# Patient Record
Sex: Female | Born: 1985 | Race: White | Hispanic: No | Marital: Married | Smoking: Former smoker
Health system: Southern US, Community
[De-identification: ages and names within clinical notes are randomized; demographics above are authoritative.]

## PROBLEM LIST (undated history)

## (undated) DIAGNOSIS — O24419 Gestational diabetes mellitus in pregnancy, unspecified control: Secondary | ICD-10-CM

## (undated) DIAGNOSIS — Z9889 Other specified postprocedural states: Secondary | ICD-10-CM

## (undated) DIAGNOSIS — F419 Anxiety disorder, unspecified: Secondary | ICD-10-CM

## (undated) DIAGNOSIS — Z9289 Personal history of other medical treatment: Secondary | ICD-10-CM

## (undated) DIAGNOSIS — R112 Nausea with vomiting, unspecified: Secondary | ICD-10-CM

## (undated) DIAGNOSIS — Z862 Personal history of diseases of the blood and blood-forming organs and certain disorders involving the immune mechanism: Secondary | ICD-10-CM

## (undated) HISTORY — PX: OTHER SURGICAL HISTORY: SHX169

## (undated) HISTORY — DX: Anxiety disorder, unspecified: F41.9

## (undated) HISTORY — DX: Gestational diabetes mellitus in pregnancy, unspecified control: O24.419

## (undated) HISTORY — DX: Personal history of diseases of the blood and blood-forming organs and certain disorders involving the immune mechanism: Z86.2

---
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
## 2008-01-06 IMAGING — CT CT PARANASAL SINUSES W/O CM
1 series · 16 of 24 positions shown, 20 images · non-contrast
Comparison: none

REASON FOR EXAM: Chronic right sided headache, clinical sinusitis
COMMENTS:

[Series 2: sinus · axial · 0.26mm/px · z∈[+763,+863]mm · 16 of 24 slices shown, 20 images]
[im 2/24  brain]
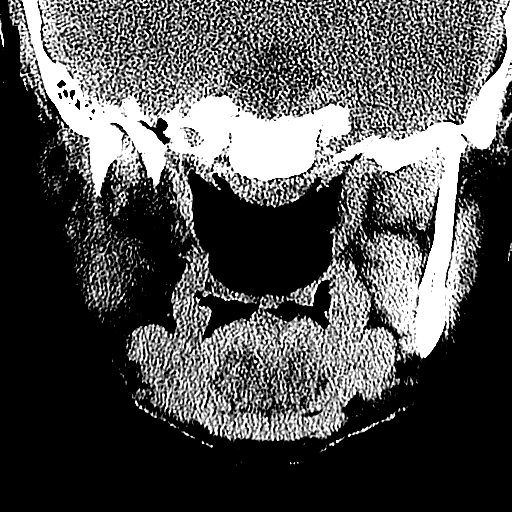
[im 2/24  bone]
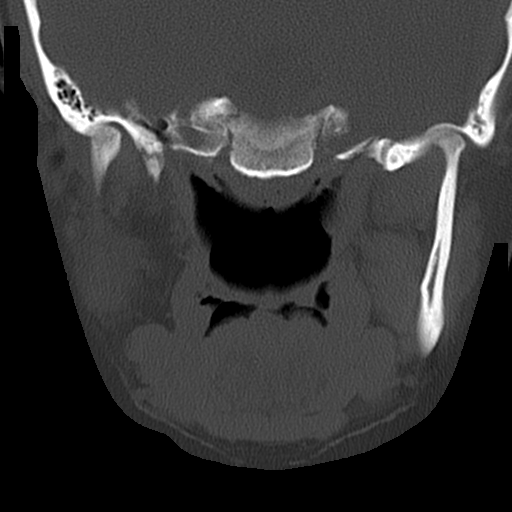
[im 4/24  bone]
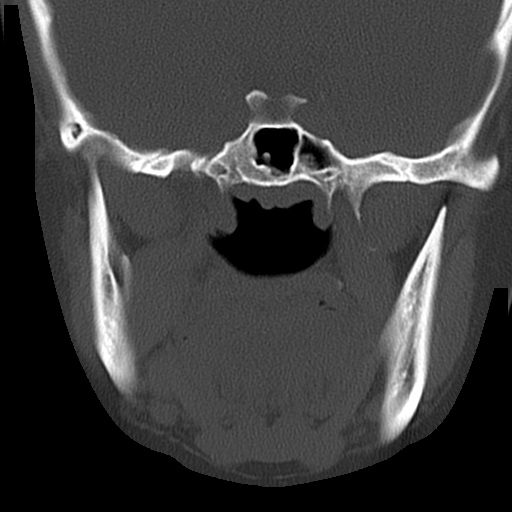
[im 5/24  bone]
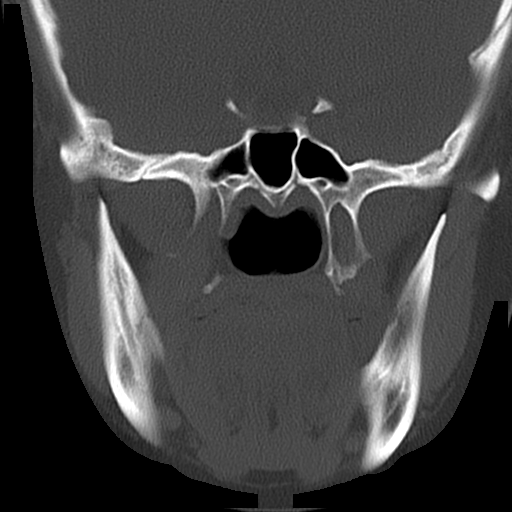
[im 6/24  bone]
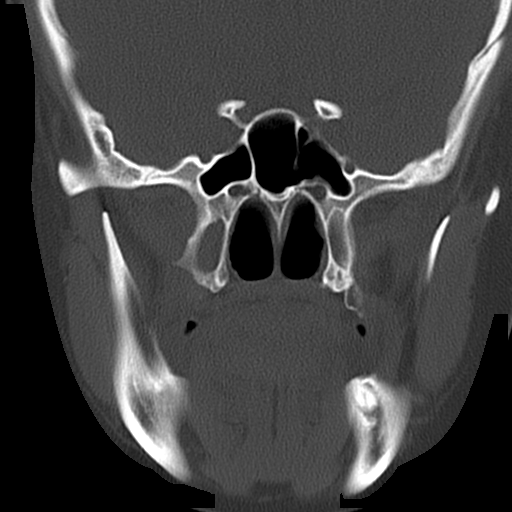
[im 8/24  brain]
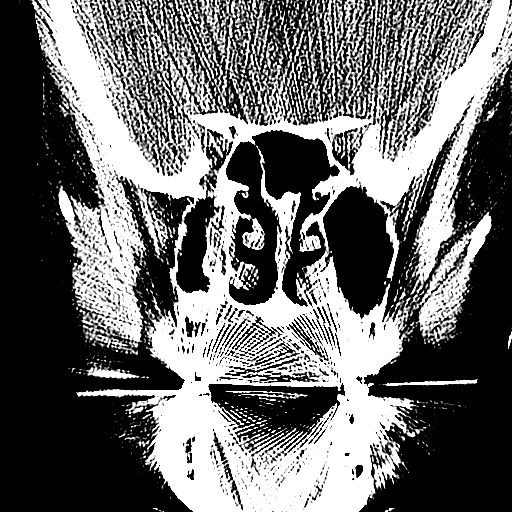
[im 8/24  bone]
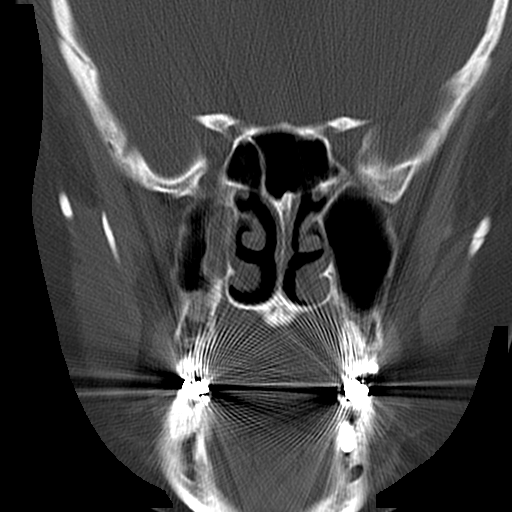
[im 9/24  bone]
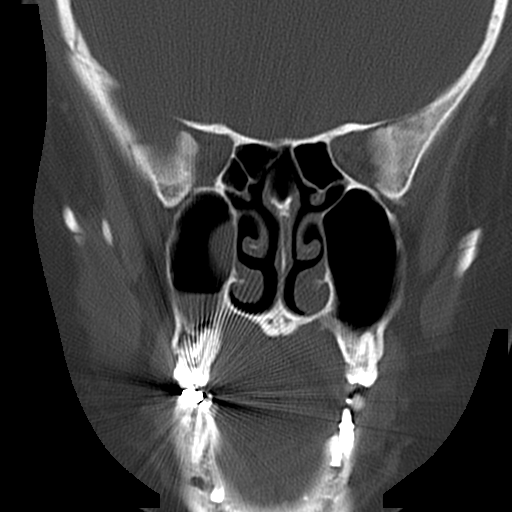
[im 10/24  bone]
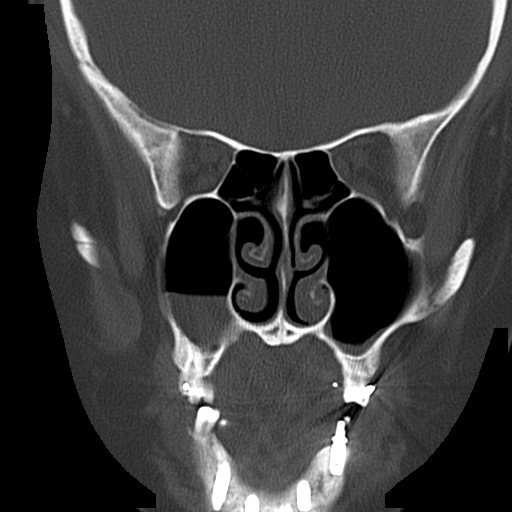
[im 12/24  bone]
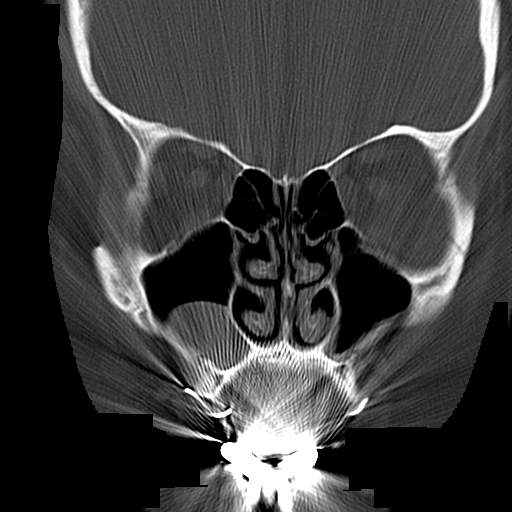
[im 13/24  brain]
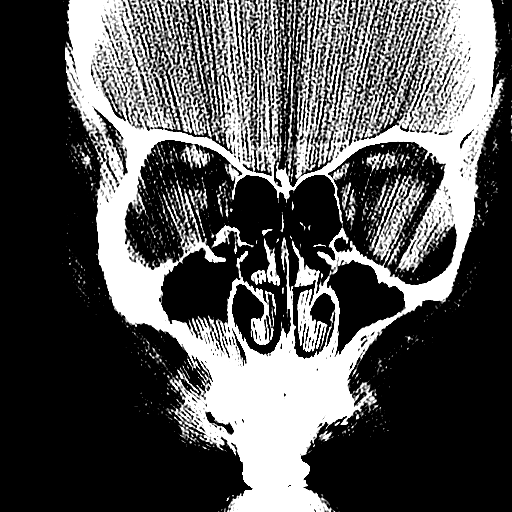
[im 13/24  bone]
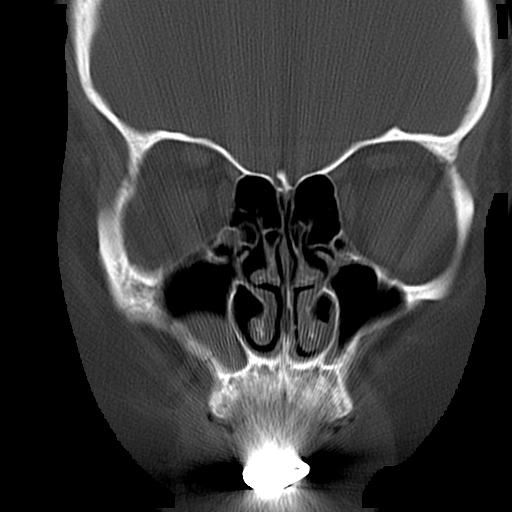
[im 15/24  bone]
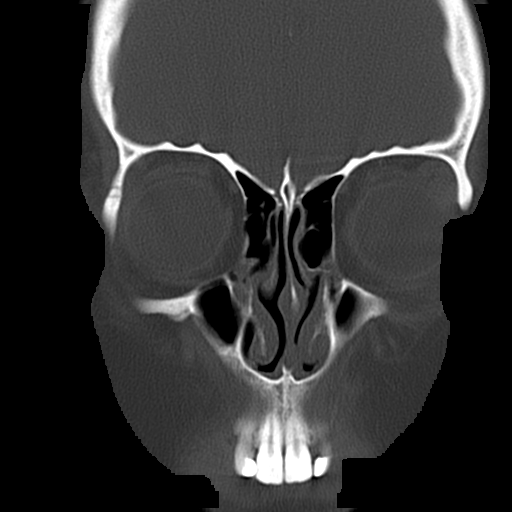
[im 16/24  bone]
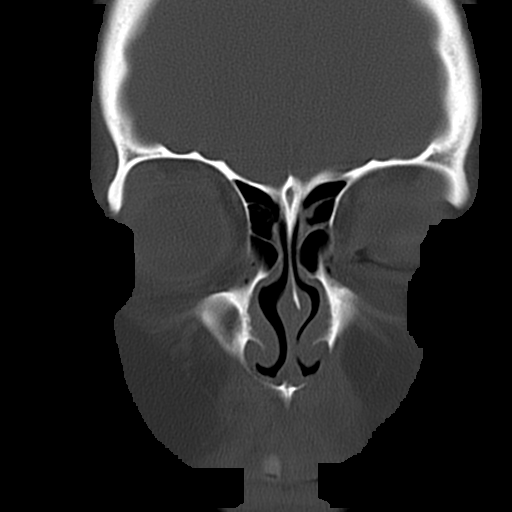
[im 17/24  bone]
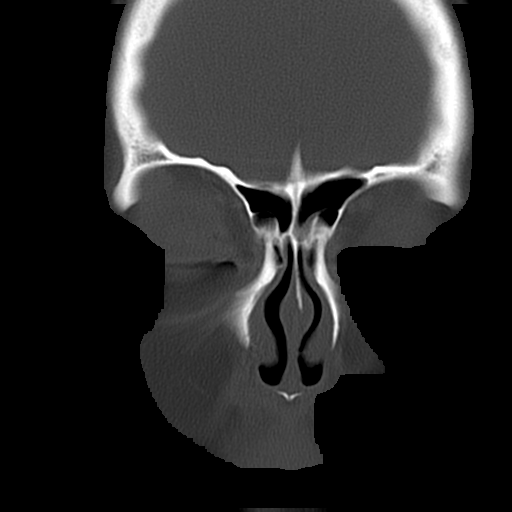
[im 19/24  brain]
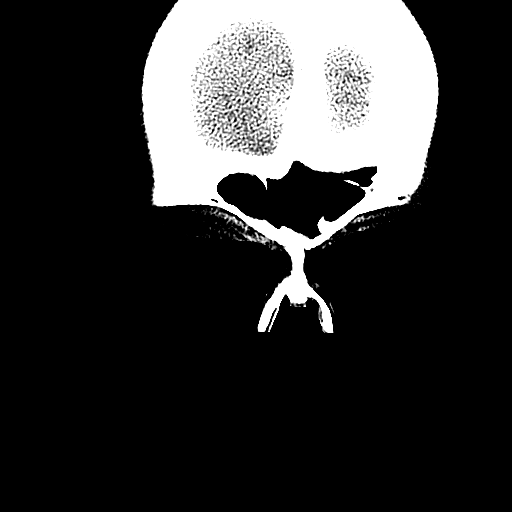
[im 19/24  bone]
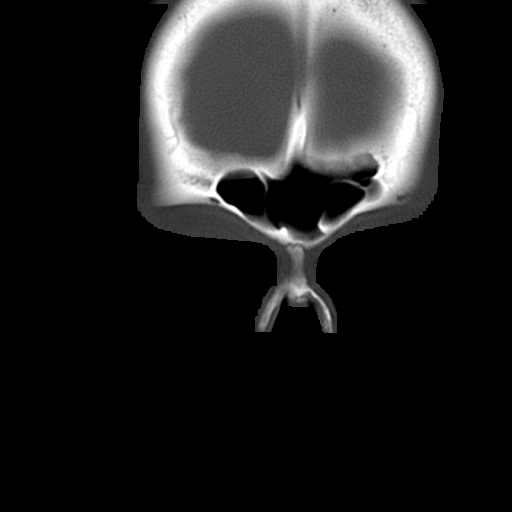
[im 20/24  bone]
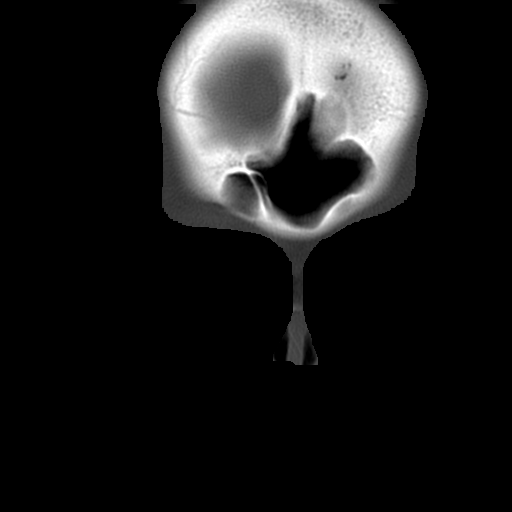
[im 21/24  bone]
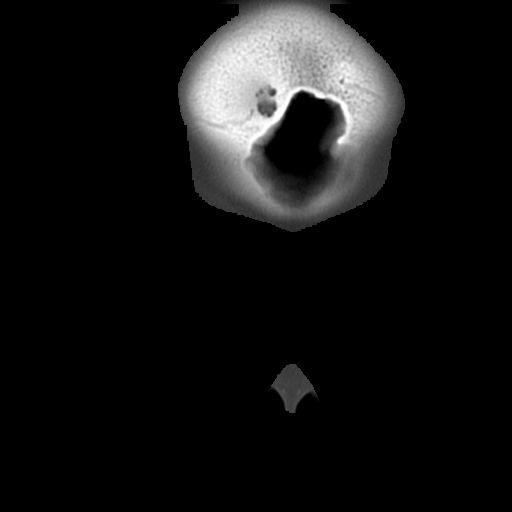
[im 23/24  bone]
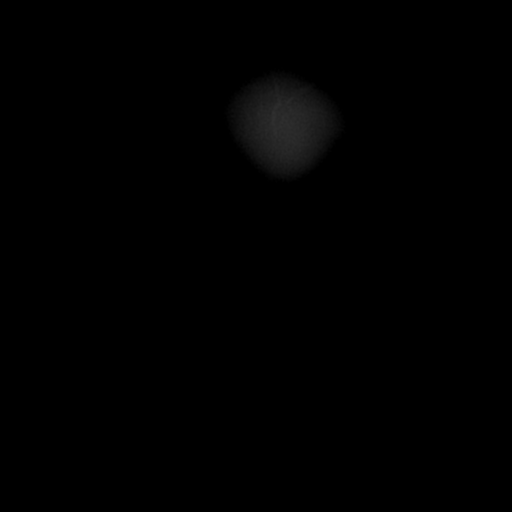

[16 of 24 positions shown; findings below may reference images not displayed]

PROCEDURE:     CT  - CT SINUSES WITHOUT CONTRAST  - January 31, 2006  [DATE]

RESULT:     The patient is being evaluated for chronic right sided headache
as well as sinusitis.

The frontal sinuses are well pneumatized. The ethmoid sinuses demonstrate a
small amount of mucoperiosteal thickening in their mid portion anteriorly.
This does impact the ostiomeatal unit. The right maxillary sinus exhibits a
retention cyst versus polyp in the inferomedial aspect. The left maxillary
sinus is clear. There may be a small amount of fluid in the right maxillary
sinus. The right ethmoid as well as both sphenoid sinuses are clear. No bony
erosive change is seen.
IMPRESSION: There are findings consistent with inflammatory changes
involving the right ethmoid and right maxillary sinuses. I cannot exclude a
small amount of fluid in the inferior aspect of the right maxillary sinus.

## 2008-04-23 DIAGNOSIS — O24419 Gestational diabetes mellitus in pregnancy, unspecified control: Secondary | ICD-10-CM

## 2008-04-23 HISTORY — DX: Gestational diabetes mellitus in pregnancy, unspecified control: O24.419

## 2008-05-03 ENCOUNTER — Encounter: Payer: Self-pay | Admitting: Family Medicine

## 2008-05-03 ENCOUNTER — Ambulatory Visit: Payer: Self-pay | Admitting: Obstetrics & Gynecology

## 2008-05-03 LAB — CONVERTED CEMR LAB
Basophils Absolute: 0 10*3/uL (ref 0.0–0.1)
Basophils Relative: 0 % (ref 0–1)
Hepatitis B Surface Ag: NEGATIVE
MCHC: 34.7 g/dL (ref 30.0–36.0)
Neutro Abs: 4.5 10*3/uL (ref 1.7–7.7)
Neutrophils Relative %: 65 % (ref 43–77)
RBC: 4.26 M/uL (ref 3.87–5.11)
RDW: 13.2 % (ref 11.5–15.5)
Rubella: 32 intl units/mL — ABNORMAL HIGH

## 2008-05-10 ENCOUNTER — Ambulatory Visit (HOSPITAL_COMMUNITY): Admission: RE | Admit: 2008-05-10 | Discharge: 2008-05-10 | Payer: Self-pay | Admitting: Family Medicine

## 2008-05-18 ENCOUNTER — Ambulatory Visit: Payer: Self-pay | Admitting: Obstetrics & Gynecology

## 2008-05-18 ENCOUNTER — Encounter: Payer: Self-pay | Admitting: Obstetrics & Gynecology

## 2008-06-14 ENCOUNTER — Ambulatory Visit: Payer: Self-pay | Admitting: Obstetrics and Gynecology

## 2008-06-15 ENCOUNTER — Encounter: Payer: Self-pay | Admitting: Family Medicine

## 2008-06-25 ENCOUNTER — Ambulatory Visit (HOSPITAL_COMMUNITY): Admission: RE | Admit: 2008-06-25 | Discharge: 2008-06-25 | Payer: Self-pay | Admitting: Obstetrics & Gynecology

## 2008-07-12 ENCOUNTER — Ambulatory Visit: Payer: Self-pay | Admitting: Obstetrics and Gynecology

## 2008-07-13 ENCOUNTER — Encounter: Payer: Self-pay | Admitting: Family Medicine

## 2008-07-15 ENCOUNTER — Ambulatory Visit: Payer: Self-pay | Admitting: Family Medicine

## 2008-07-15 LAB — CONVERTED CEMR LAB

## 2008-07-17 ENCOUNTER — Observation Stay (HOSPITAL_COMMUNITY): Admission: AD | Admit: 2008-07-17 | Discharge: 2008-07-18 | Payer: Self-pay | Admitting: Family Medicine

## 2008-07-17 ENCOUNTER — Encounter: Payer: Self-pay | Admitting: Family Medicine

## 2008-08-16 ENCOUNTER — Ambulatory Visit: Payer: Self-pay | Admitting: Obstetrics & Gynecology

## 2008-08-23 ENCOUNTER — Ambulatory Visit: Payer: Self-pay | Admitting: Family Medicine

## 2008-08-30 ENCOUNTER — Ambulatory Visit: Payer: Self-pay | Admitting: Obstetrics & Gynecology

## 2008-08-30 ENCOUNTER — Encounter: Payer: Self-pay | Admitting: Family Medicine

## 2008-08-30 LAB — CONVERTED CEMR LAB
Hemoglobin: 9.8 g/dL — ABNORMAL LOW (ref 12.0–15.0)
RBC: 3.13 M/uL — ABNORMAL LOW (ref 3.87–5.11)
RDW: 14 % (ref 11.5–15.5)

## 2008-09-06 ENCOUNTER — Ambulatory Visit: Payer: Self-pay | Admitting: Family Medicine

## 2008-09-13 ENCOUNTER — Encounter: Admission: RE | Admit: 2008-09-13 | Discharge: 2008-09-13 | Payer: Self-pay | Admitting: Obstetrics & Gynecology

## 2008-09-13 ENCOUNTER — Ambulatory Visit: Payer: Self-pay | Admitting: Obstetrics & Gynecology

## 2008-09-17 ENCOUNTER — Ambulatory Visit (HOSPITAL_COMMUNITY): Admission: RE | Admit: 2008-09-17 | Discharge: 2008-09-17 | Payer: Self-pay | Admitting: Obstetrics & Gynecology

## 2008-09-21 ENCOUNTER — Ambulatory Visit (HOSPITAL_COMMUNITY): Admission: RE | Admit: 2008-09-21 | Discharge: 2008-09-21 | Payer: Self-pay | Admitting: Obstetrics & Gynecology

## 2008-09-25 ENCOUNTER — Observation Stay (HOSPITAL_COMMUNITY): Admission: AD | Admit: 2008-09-25 | Discharge: 2008-09-26 | Payer: Self-pay | Admitting: Obstetrics & Gynecology

## 2008-09-27 ENCOUNTER — Inpatient Hospital Stay (HOSPITAL_COMMUNITY): Admission: AD | Admit: 2008-09-27 | Discharge: 2008-09-29 | Payer: Self-pay | Admitting: Obstetrics & Gynecology

## 2008-09-27 ENCOUNTER — Ambulatory Visit: Payer: Self-pay | Admitting: Obstetrics & Gynecology

## 2008-09-28 ENCOUNTER — Encounter: Payer: Self-pay | Admitting: Obstetrics & Gynecology

## 2008-09-29 ENCOUNTER — Encounter: Payer: Self-pay | Admitting: Obstetrics & Gynecology

## 2008-10-28 ENCOUNTER — Ambulatory Visit: Payer: Self-pay | Admitting: Obstetrics & Gynecology

## 2008-11-03 ENCOUNTER — Ambulatory Visit: Payer: Self-pay | Admitting: Obstetrics & Gynecology

## 2008-12-06 ENCOUNTER — Encounter: Payer: Self-pay | Admitting: Obstetrics and Gynecology

## 2008-12-06 ENCOUNTER — Ambulatory Visit: Payer: Self-pay | Admitting: Obstetrics and Gynecology

## 2008-12-15 ENCOUNTER — Ambulatory Visit: Payer: Self-pay | Admitting: Family Medicine

## 2008-12-15 LAB — CONVERTED CEMR LAB: Glucose, Fasting: 74 mg/dL (ref 70–99)

## 2010-02-27 ENCOUNTER — Ambulatory Visit: Payer: Self-pay | Admitting: Internal Medicine

## 2010-05-15 ENCOUNTER — Encounter: Payer: Self-pay | Admitting: Obstetrics & Gynecology

## 2010-05-15 ENCOUNTER — Encounter: Payer: Self-pay | Admitting: Obstetrics

## 2010-07-31 LAB — COMPREHENSIVE METABOLIC PANEL
AST: 19 U/L (ref 0–37)
BUN: 4 mg/dL — ABNORMAL LOW (ref 6–23)
BUN: 5 mg/dL — ABNORMAL LOW (ref 6–23)
CO2: 23 mEq/L (ref 19–32)
CO2: 23 mEq/L (ref 19–32)
Calcium: 8.8 mg/dL (ref 8.4–10.5)
Calcium: 9 mg/dL (ref 8.4–10.5)
Chloride: 105 mEq/L (ref 96–112)
Creatinine, Ser: 0.48 mg/dL (ref 0.4–1.2)
Creatinine, Ser: 0.52 mg/dL (ref 0.4–1.2)
GFR calc Af Amer: >60 mL/min (ref >60)
GFR calc non Af Amer: >60 mL/min (ref >60)
GFR calc non Af Amer: >60 mL/min (ref >60)
Glucose, Bld: 88 mg/dL (ref 70–99)
Total Bilirubin: 0.7 mg/dL (ref 0.3–1.2)

## 2010-07-31 LAB — GLUCOSE, CAPILLARY
Glucose-Capillary: 114 mg/dL — ABNORMAL HIGH (ref 70–99)
Glucose-Capillary: 92 mg/dL (ref 70–99)
Glucose-Capillary: 92 mg/dL (ref 70–99)
Glucose-Capillary: 96 mg/dL (ref 70–99)

## 2010-07-31 LAB — CBC
HCT: 27.1 % — ABNORMAL LOW (ref 36.0–46.0)
HCT: 31.2 % — ABNORMAL LOW (ref 36.0–46.0)
HCT: 32 % — ABNORMAL LOW (ref 36.0–46.0)
Hemoglobin: 9.6 g/dL — ABNORMAL LOW (ref 12.0–15.0)
MCHC: 34.9 g/dL (ref 30.0–36.0)
MCHC: 34.9 g/dL (ref 30.0–36.0)
MCHC: 35.4 g/dL (ref 30.0–36.0)
MCV: 93.4 fL (ref 78.0–100.0)
MCV: 94.2 fL (ref 78.0–100.0)
Platelets: 130 10*3/uL — ABNORMAL LOW (ref 150–400)
RBC: 2.9 MIL/uL — ABNORMAL LOW (ref 3.87–5.11)
RBC: 3.34 MIL/uL — ABNORMAL LOW (ref 3.87–5.11)
RBC: 3.39 MIL/uL — ABNORMAL LOW (ref 3.87–5.11)
RDW: 14.8 % (ref 11.5–15.5)
WBC: 9.5 10*3/uL (ref 4.0–10.5)

## 2010-07-31 LAB — DIFFERENTIAL
Basophils Relative: 0 % (ref 0–1)
Eosinophils Relative: 0 % (ref 0–5)
Monocytes Absolute: 0.9 10*3/uL (ref 0.1–1.0)
Monocytes Relative: 10 % (ref 3–12)
Neutro Abs: 7 10*3/uL (ref 1.7–7.7)

## 2010-07-31 LAB — URINALYSIS, ROUTINE W REFLEX MICROSCOPIC
Hgb urine dipstick: NEGATIVE
Ketones, ur: >80 mg/dL — AB
Protein, ur: NEGATIVE mg/dL
Urobilinogen, UA: 0.2 mg/dL (ref 0.0–1.0)

## 2010-07-31 LAB — URINE MICROSCOPIC-ADD ON

## 2010-08-01 LAB — PARVOVIRUS B19 ANTIBODY, IGG AND IGM
Parovirus B19 IgG Abs: 2.7 Index — ABNORMAL HIGH (ref <0.9)
Parovirus B19 IgM Abs: <0.9 Index (ref <0.9)

## 2010-08-03 LAB — CBC
HCT: 31.3 % — ABNORMAL LOW (ref 36.0–46.0)
Hemoglobin: 10.6 g/dL — ABNORMAL LOW (ref 12.0–15.0)
Hemoglobin: 11.1 g/dL — ABNORMAL LOW (ref 12.0–15.0)
MCHC: 33.9 g/dL (ref 30.0–36.0)
MCHC: 34.6 g/dL (ref 30.0–36.0)
RBC: 3.44 MIL/uL — ABNORMAL LOW (ref 3.87–5.11)
RDW: 13.7 % (ref 11.5–15.5)

## 2010-08-03 LAB — URINALYSIS, ROUTINE W REFLEX MICROSCOPIC
Bilirubin Urine: NEGATIVE
Ketones, ur: 15 mg/dL — AB
Nitrite: NEGATIVE
Protein, ur: NEGATIVE mg/dL
Urobilinogen, UA: 0.2 mg/dL (ref 0.0–1.0)
pH: 6.5 (ref 5.0–8.0)

## 2010-08-03 LAB — DIFFERENTIAL
Basophils Relative: 0 % (ref 0–1)
Lymphocytes Relative: 15 % (ref 12–46)
Monocytes Absolute: 0.6 10*3/uL (ref 0.1–1.0)
Monocytes Relative: 6 % (ref 3–12)
Neutro Abs: 7.4 10*3/uL (ref 1.7–7.7)

## 2010-08-03 LAB — URINE MICROSCOPIC-ADD ON

## 2010-09-05 NOTE — Assessment & Plan Note (Signed)
NAMEMAYLIE, Sharon               ACCOUNT NO.:  [id number]   MEDICAL RECORD NO.:  [id number]          PATIENT TYPE:  POB   LOCATION:  [facility] at [facility]         FACILITY:  [facility]   PHYSICIAN:  Argentina Donovan, MD        DATE OF BIRTH:  1985-10-10   DATE OF SERVICE:  12/06/2008                                  CLINIC NOTE   The patient is a 25 year old, gravida 1, para 1-0-0-1, who delivered by  cesarean section at 34 weeks because of hydrops with severe  polyhydramnios, breech presentation, gestational diabetes of the baby,  weighed 7 pounds 4 ounces, 6 weeks' preterm, and has been doing very  well.  The patient delivered at [facility], so we do not have her  delivery record as yet.  She decided that she would Implanon for birth  control.  We have talked to her about that as well as the IUD and she is  going to make her choice.   PHYSICAL EXAMINATION:  VITAL SIGNS:  Her blood pressure is 105/70, her  pulse 70 per minute, weight is 157, and she is 5 feet 2 inches tall.  ABDOMEN:  Soft, nontender.  No masses or organomegaly.  The scars are  well healed.  External genitalia is normal.  BUS within normal limits.  Vagina is clean and well rugated.  Cervix is clean and nulliparous.  The  Pap smear was taken and the uterus anterior, well involuted to normal  size, shape, consistency with normal adnexa.   The patient come back for a hemoglobin A1c and a 75 g glucose to our  tests and she is going to decide on the birth control type.           ______________________________  Argentina Donovan, MD     PR/MEDQ  D:  12/06/2008  T:  12/07/2008  Job:  [id number]

## 2010-09-05 NOTE — Discharge Summary (Signed)
[name], Sharon Warren               ACCOUNT NO.:  [id number]   MEDICAL RECORD NO.:  [id number]          PATIENT TYPE:  OBV   LOCATION:  9318                          FACILITY:  [facility]   PHYSICIAN:  Tilda Burrow, M.D. DATE OF BIRTH:  09-01-1985   DATE OF ADMISSION:  07/17/2008  DATE OF DISCHARGE:  07/18/2008                               DISCHARGE SUMMARY   ADMITTING DIAGNOSES:  1. Pregnancy 21 weeks' gestation.  2. Right lower quadrant pain.  3. Suspected gastroenteritis.  4. Mild dehydration.   DISCHARGE DIAGNOSES:  1. Pregnancy 21 weeks, not delivered.  2. Gastroenteritis.  3. Dehydration resolved.   HOSPITAL SUMMARY:  This is a 25 year old female gravida 1, para 0, 21  weeks' gestation, who was placed in the extended observation on July 17, 2008, and after being seen for right-sided abdominal pain without  significant diarrhea after her pregnancy course, which had been  relatively uneventful.  Initially, the temperature 97.5, blood pressure  126/76, pulse 70, and respirations 20.  She was evaluated with  urinalysis, which showed specific gravity 1.010, negative for nitrites,  protein, but positive for moderate leukocyte esterase with white cells 7-  10, and very few bacteria.  CBC showed white count 9600, hemoglobin 11,  and hematocrit 32.  Ultrasound of the pelvis showed noncomplicated  intrauterine pregnancy with normal right ovary and nonvisualization of  the appendix.  MRI was ordered as a result of this, and the patient  underwent MRI, which was negative for mild abnormalities.  The patient  was observed.  The MRI had the following statement, the cecum is low  lying abutting the right adnexa, and the gravid uterus.  There is no  wall thickening or surrounding inflammatory changes.  The appendix was  not conclusively identified.  There is no evidence of appendicitis or  pelvic abscess.   The patient was observed overnight and given fluids on July 18, 2008.  The  patient has slow improvement in her abdominal discomfort, but had  complete resolution of her nausea, tolerated dinner, bland foods, and  was examined, was found to have active bowel sounds, no rebound  tenderness, only mild guarding in the right side of the abdomen, and  absolutely not on the left.  She was discharged home on the following  meds, prenatal vitamins as previously prescribed, Phenergan 25 mg 10  tablets, one q.6 h. p.r.n. nausea, if it reoccurs.  Tylenol No. 3, 20  tablets, two q.4 h. p.r.n. mild abdominal discomfort.  The patient is to  return for worsening symptoms or severe increasing abdominal pain, but  otherwise keep routine followup appointments.  The patient was advised  to take 3 days off work before resuming normal activities.   ADDENDUM   Conversation with the patient's family indicated that her husband did  experience similar symptoms earlier last week with right-sided abdominal  pain and associated diarrhea for 2 days.      Tilda Burrow, M.D.  Electronically Signed     JVF/MEDQ  D:  07/18/2008  T:  07/19/2008  Job:  [id number]

## 2010-09-05 NOTE — Assessment & Plan Note (Signed)
NAMEZHARA, GIESKE               ACCOUNT NO.:  [id number]   MEDICAL RECORD NO.:  [id number]          PATIENT TYPE:  POB   LOCATION:  [facility] at [facility]         FACILITY:  [facility]   PHYSICIAN:  Tinnie Gens, MD        DATE OF BIRTH:  March 12, 1986   DATE OF SERVICE:  12/15/2008                                  CLINIC NOTE   CHIEF COMPLAINT:  Implanon insertion.   HISTORY OF PRESENT ILLNESS:  The patient is a 25 year old, gravida 1,  para 1, who is status post vaginal delivery.  She is status post C-  section delivery in July.  The patient has decided for Implanon.  She is  advised about bleeding, risk with this device, and she understands that,  and would like to proceed.   PROCEDURE:  The patient's arm was washed with alcohol and injected with  lidocaine.  It was then cleaned with Betadine and under sterile  technique.  A 15 blade was used to make small incisional arm and then  the Implanon was inserted in the patient's left arm between the biceps  and triceps fold.  The patient tolerated the procedure well.  No  significant bleeding.  Pressure dressing was applied.   IMPRESSION:  Undesired fertility, status post Implanon insertion.   PLAN:  Follow up in 2 weeks for recheck.           ______________________________  Tinnie Gens, MD     TP/MEDQ  D:  12/15/2008  T:  12/16/2008  Job:  [id number]

## 2011-02-26 ENCOUNTER — Ambulatory Visit: Payer: Self-pay | Admitting: Obstetrics and Gynecology

## 2011-03-02 ENCOUNTER — Encounter: Payer: Self-pay | Admitting: Gynecology

## 2011-03-05 ENCOUNTER — Ambulatory Visit: Payer: Self-pay | Admitting: Obstetrics & Gynecology

## 2011-03-05 DIAGNOSIS — Z01419 Encounter for gynecological examination (general) (routine) without abnormal findings: Secondary | ICD-10-CM

## 2014-02-22 ENCOUNTER — Encounter: Payer: Self-pay | Admitting: Gynecology

## 2014-11-16 ENCOUNTER — Encounter: Payer: Self-pay | Admitting: *Deleted

## 2014-11-17 ENCOUNTER — Ambulatory Visit (INDEPENDENT_AMBULATORY_CARE_PROVIDER_SITE_OTHER): Payer: BLUE CROSS/BLUE SHIELD | Admitting: Obstetrics and Gynecology

## 2014-11-17 ENCOUNTER — Encounter: Payer: Self-pay | Admitting: Obstetrics and Gynecology

## 2014-11-17 VITALS — BP 130/84 | HR 94 | Ht 63.0 in | Wt 185.7 lb

## 2014-11-17 DIAGNOSIS — E669 Obesity, unspecified: Secondary | ICD-10-CM

## 2014-11-17 DIAGNOSIS — O09291 Supervision of pregnancy with other poor reproductive or obstetric history, first trimester: Secondary | ICD-10-CM | POA: Diagnosis not present

## 2014-11-17 DIAGNOSIS — O99211 Obesity complicating pregnancy, first trimester: Secondary | ICD-10-CM

## 2014-11-17 DIAGNOSIS — Z8632 Personal history of gestational diabetes: Secondary | ICD-10-CM

## 2014-11-17 DIAGNOSIS — N926 Irregular menstruation, unspecified: Secondary | ICD-10-CM | POA: Diagnosis not present

## 2014-11-17 LAB — POCT URINE PREGNANCY: Preg Test, Ur: POSITIVE — AB

## 2014-11-17 NOTE — Progress Notes (Signed)
Subjective:     Patient ID: Sharon Warren, female   DOB: October 30, 1985, 29 y.o.   MRN: [id number]  HPI LMP 10/14/14 with normal menses, +UPT at home last week, happy about planned pregancy  Review of Systems Missed menses and fatigue x 1 week    Objective:   Physical Exam A&O x4 Well groomed mildly obese female in no distress UPT +    Assessment:     Early pregnancy previouse GDM Obesity     Plan:     Viability scan and NOB nurse labs in 3 weeks, NOB PE in 6 weeks  Melody Ines Bloomer, CNM

## 2014-12-08 ENCOUNTER — Encounter: Payer: Self-pay | Admitting: Obstetrics and Gynecology

## 2014-12-08 ENCOUNTER — Ambulatory Visit (INDEPENDENT_AMBULATORY_CARE_PROVIDER_SITE_OTHER): Payer: BLUE CROSS/BLUE SHIELD | Admitting: Obstetrics and Gynecology

## 2014-12-08 ENCOUNTER — Ambulatory Visit: Payer: BLUE CROSS/BLUE SHIELD

## 2014-12-08 VITALS — BP 109/73 | HR 85 | Wt 188.4 lb

## 2014-12-08 DIAGNOSIS — N926 Irregular menstruation, unspecified: Secondary | ICD-10-CM

## 2014-12-08 DIAGNOSIS — Z3491 Encounter for supervision of normal pregnancy, unspecified, first trimester: Secondary | ICD-10-CM

## 2014-12-08 NOTE — Progress Notes (Cosign Needed)
Pt is here for NOB nurse intake, had her viability scan as well, we discussed the NOB packet and answered all questions, Panaroma info given pt will let us know at next  Office visit Pt would like rx sent in for eczema- has b itchy area on arms

## 2014-12-09 LAB — CBC WITH DIFFERENTIAL/PLATELET
Basophils Absolute: 0 10*3/uL (ref 0.0–0.2)
Basos: 0 %
EOS (ABSOLUTE): 0.2 10*3/uL (ref 0.0–0.4)
Eos: 2 %
HEMATOCRIT: 35.1 % (ref 34.0–46.6)
HEMOGLOBIN: 12 g/dL (ref 11.1–15.9)
Immature Grans (Abs): 0 10*3/uL (ref 0.0–0.1)
Immature Granulocytes: 0 %
Lymphocytes Absolute: 2.1 10*3/uL (ref 0.7–3.1)
Lymphs: 24 %
MCH: 30.6 pg (ref 26.6–33.0)
MCHC: 34.2 g/dL (ref 31.5–35.7)
MCV: 90 fL (ref 79–97)
MONOCYTES: 5 %
Monocytes Absolute: 0.4 10*3/uL (ref 0.1–0.9)
NEUTROS ABS: 6.1 10*3/uL (ref 1.4–7.0)
Neutrophils: 69 %
Platelets: 174 10*3/uL (ref 150–379)
RBC: 3.92 x10E6/uL (ref 3.77–5.28)
RDW: 13.1 % (ref 12.3–15.4)
WBC: 8.8 10*3/uL (ref 3.4–10.8)

## 2014-12-09 LAB — HEP, RPR, HIV PANEL
HIV Screen 4th Generation wRfx: NONREACTIVE
Hepatitis B Surface Ag: NEGATIVE
RPR Ser Ql: NONREACTIVE

## 2014-12-09 LAB — ANTIBODY SCREEN: ANTIBODY SCREEN: NEGATIVE

## 2014-12-09 LAB — ABO AND RH: Rh Factor: POSITIVE

## 2014-12-09 LAB — RUBELLA SCREEN: Rubella Antibodies, IGG: 1.45 index (ref >0.99)

## 2014-12-10 ENCOUNTER — Encounter: Payer: Self-pay | Admitting: Obstetrics and Gynecology

## 2014-12-20 ENCOUNTER — Telehealth: Payer: Self-pay | Admitting: Obstetrics and Gynecology

## 2014-12-20 NOTE — Telephone Encounter (Signed)
PT CALLED AND SHE WANTED TO KNOW THE RESULTS OF HER GLUCOSE TEST.

## 2014-12-29 ENCOUNTER — Other Ambulatory Visit: Payer: Self-pay

## 2014-12-29 ENCOUNTER — Encounter: Payer: Self-pay | Admitting: Obstetrics and Gynecology

## 2014-12-29 ENCOUNTER — Other Ambulatory Visit: Payer: Self-pay | Admitting: Obstetrics and Gynecology

## 2014-12-29 ENCOUNTER — Ambulatory Visit (INDEPENDENT_AMBULATORY_CARE_PROVIDER_SITE_OTHER): Payer: BLUE CROSS/BLUE SHIELD | Admitting: Obstetrics and Gynecology

## 2014-12-29 VITALS — BP 123/85 | HR 98 | Wt 191.1 lb

## 2014-12-29 DIAGNOSIS — Z3491 Encounter for supervision of normal pregnancy, unspecified, first trimester: Secondary | ICD-10-CM

## 2014-12-29 DIAGNOSIS — O99211 Obesity complicating pregnancy, first trimester: Secondary | ICD-10-CM

## 2014-12-29 LAB — POCT URINALYSIS DIPSTICK
Bilirubin, UA: NEGATIVE
Glucose, UA: NEGATIVE
Ketones, UA: NEGATIVE
LEUKOCYTES UA: NEGATIVE
NITRITE UA: NEGATIVE
PH UA: 6
PROTEIN UA: NEGATIVE
RBC UA: NEGATIVE
Spec Grav, UA: 1.02
UROBILINOGEN UA: 0.2

## 2014-12-29 NOTE — Telephone Encounter (Signed)
Per Nivedita Mirabella-[facility] glucola was missed on draw pt is coming in 12/29/14 for genetic testing will get then

## 2014-12-29 NOTE — Progress Notes (Signed)
ROB-pt denies any new complaints, glucose needs to be redrawn due to [facility] error, pt would like Panaroma

## 2014-12-29 NOTE — Patient Instructions (Signed)
Second Trimester of Pregnancy The second trimester is from week 13 through week 28, months 4 through 6. The second trimester is often a time when you feel your best. Your body has also adjusted to being pregnant, and you begin to feel better physically. Usually, morning sickness has lessened or quit completely, you may have more energy, and you may have an increase in appetite. The second trimester is also a time when the fetus is growing rapidly. At the end of the sixth month, the fetus is about 9 inches long and weighs about 1 pounds. You will likely begin to feel the baby move (quickening) between 18 and 20 weeks of the pregnancy. BODY CHANGES Your body goes through many changes during pregnancy. The changes vary from woman to woman.   Your weight will continue to increase. You will notice your lower abdomen bulging out.  You may begin to get stretch marks on your hips, abdomen, and breasts.  You may develop headaches that can be relieved by medicines approved by your health care provider.  You may urinate more often because the fetus is pressing on your bladder.  You may develop or continue to have heartburn as a result of your pregnancy.  You may develop constipation because certain hormones are causing the muscles that push waste through your intestines to slow down.  You may develop hemorrhoids or swollen, bulging veins (varicose veins).  You may have back pain because of the weight gain and pregnancy hormones relaxing your joints between the bones in your pelvis and as a result of a shift in weight and the muscles that support your balance.  Your breasts will continue to grow and be tender.  Your gums may bleed and may be sensitive to brushing and flossing.  Dark spots or blotches (chloasma, mask of pregnancy) may develop on your face. This will likely fade after the baby is born.  A dark line from your belly button to the pubic area (linea nigra) may appear. This will likely fade  after the baby is born.  You may have changes in your hair. These can include thickening of your hair, rapid growth, and changes in texture. Some women also have hair loss during or after pregnancy, or hair that feels dry or thin. Your hair will most likely return to normal after your baby is born. WHAT TO EXPECT AT YOUR PRENATAL VISITS During a routine prenatal visit:  You will be weighed to make sure you and the fetus are growing normally.  Your blood pressure will be taken.  Your abdomen will be measured to track your baby's growth.  The fetal heartbeat will be listened to.  Any test results from the previous visit will be discussed. Your health care provider may ask you:  How you are feeling.  If you are feeling the baby move.  If you have had any abnormal symptoms, such as leaking fluid, bleeding, severe headaches, or abdominal cramping.  If you have any questions. Other tests that may be performed during your second trimester include:  Blood tests that check for:  Low iron levels (anemia).  Gestational diabetes (between 24 and 28 weeks).  Rh antibodies.  Urine tests to check for infections, diabetes, or protein in the urine.  An ultrasound to confirm the proper growth and development of the baby.  An amniocentesis to check for possible genetic problems.  Fetal screens for spina bifida and Down syndrome. HOME CARE INSTRUCTIONS   Avoid all smoking, herbs, alcohol, and unprescribed   drugs. These chemicals affect the formation and growth of the baby.  Follow your health care provider's instructions regarding medicine use. There are medicines that are either safe or unsafe to take during pregnancy.  Exercise only as directed by your health care provider. Experiencing uterine cramps is a good sign to stop exercising.  Continue to eat regular, healthy meals.  Wear a good support bra for breast tenderness.  Do not use hot tubs, steam rooms, or saunas.  Wear your  seat belt at all times when driving.  Avoid raw meat, uncooked cheese, cat litter boxes, and soil used by cats. These carry germs that can cause birth defects in the baby.  Take your prenatal vitamins.  Try taking a stool softener (if your health care provider approves) if you develop constipation. Eat more high-fiber foods, such as fresh vegetables or fruit and whole grains. Drink plenty of fluids to keep your urine clear or pale yellow.  Take warm sitz baths to soothe any pain or discomfort caused by hemorrhoids. Use hemorrhoid cream if your health care provider approves.  If you develop varicose veins, wear support hose. Elevate your feet for 15 minutes, 3-4 times a day. Limit salt in your diet.  Avoid heavy lifting, wear low heel shoes, and practice good posture.  Rest with your legs elevated if you have leg cramps or low back pain.  Visit your dentist if you have not gone yet during your pregnancy. Use a soft toothbrush to brush your teeth and be gentle when you floss.  A sexual relationship may be continued unless your health care provider directs you otherwise.  Continue to go to all your prenatal visits as directed by your health care provider. SEEK MEDICAL CARE IF:   You have dizziness.  You have mild pelvic cramps, pelvic pressure, or nagging pain in the abdominal area.  You have persistent nausea, vomiting, or diarrhea.  You have a bad smelling vaginal discharge.  You have pain with urination. SEEK IMMEDIATE MEDICAL CARE IF:   You have a fever.  You are leaking fluid from your vagina.  You have spotting or bleeding from your vagina.  You have severe abdominal cramping or pain.  You have rapid weight gain or loss.  You have shortness of breath with chest pain.  You notice sudden or extreme swelling of your face, hands, ankles, feet, or legs.  You have not felt your baby move in over an hour.  You have severe headaches that do not go away with  medicine.  You have vision changes. Document Released: 04/03/2001 Document Revised: 04/14/2013 Document Reviewed: 06/10/2012 Exit[employer] Patient Information 2015 [employer]. This information is not intended to replace advice given to you by your health care provider. Make sure you discuss any questions you have with your health care provider.  

## 2014-12-29 NOTE — Progress Notes (Signed)
NEW OB HISTORY AND PHYSICAL  SUBJECTIVE:       Sharon Warren is a 29 y.o. [id number] female, Patient's last menstrual period was 10/14/2014 (exact date)., Estimated Date of Delivery: 07/21/15, [redacted]w[redacted]d, presents today for establishment of Prenatal Care. She has no unusual complaints and complains of nothing      Gynecologic History Patient's last menstrual period was 10/14/2014 (exact date). Normal Contraception: none Last Pap: 2015. Results were: normal  Obstetric History OB History  Gravida Para Term Preterm AB SAB TAB Ectopic Multiple Living  # Outcome Date GA Lbr Len/2nd Weight Sex Delivery Anes PTL Lv  2 Current           1 Preterm 10/18/08 [redacted]w[redacted]d  7 lb 4 oz (3.289 kg)  CS-LTranv   Y    Obstetric Comments  Previous infant with 'leaky bowels' in-utero and scheduled c/s delivery at 34 weeks- normal now    Past Medical History  Diagnosis Date  . Asthma   . GDM (gestational diabetes mellitus) 2010  . History of anemia   . Anxiety     Past Surgical History  Procedure Laterality Date  . Cesarean section  2010    Current Outpatient Prescriptions on File Prior to Visit  Medication Sig Dispense Refill  . Nebulizer MISC by Does not apply route.      . Prenatal Vit-Fe Fumarate-FA (PRENATAL MULTIVITAMIN) TABS tablet Take 1 tablet by mouth daily at 12 noon.     No current facility-administered medications on file prior to visit.    No Known Allergies  Social History   Social History  . Marital Status: Married    Spouse Name: N/A  . Number of Children: N/A  . Years of Education: N/A   Occupational History  . Not on file.   Social History Main Topics  . Smoking status: Former Smoker    Quit date: 10/11/2014  . Smokeless tobacco: Never Used  . Alcohol Use: 0.0 oz/week    0 Standard drinks or equivalent per week  . Drug Use: No  . Sexual Activity: Yes    Birth Control/ Protection: None   Other Topics Concern  . Not on file   Social History  Narrative    Family History  Problem Relation Age of Onset  . Heart disease Paternal Grandfather   . Diabetes Paternal Grandfather   . Hypertension Maternal Grandfather   . Diabetes Father     The following portions of the patient's history were reviewed and updated as appropriate: allergies, current medications, past OB history, past medical history, past surgical history, past family history, past social history, and problem list.    OBJECTIVE: Initial Physical Exam (New OB)  GENERAL APPEARANCE: alert, well appearing, in no apparent distress, oriented to person, place and time, overweight, well hydrated HEAD: normocephalic, atraumatic MOUTH: mucous membranes moist, pharynx normal without lesions THYROID: no thyromegaly or masses present BREASTS: no masses noted, no significant tenderness, no palpable axillary nodes, no skin changes LUNGS: clear to auscultation, no wheezes, rales or rhonchi, symmetric air entry HEART: regular rate and rhythm, no murmurs ABDOMEN: soft, nontender, nondistended, no abnormal masses, no epigastric pain, obese, fundus not palpable and FHT present EXTREMITIES: no redness or tenderness in the calves or thighs SKIN: normal coloration and turgor, no rashes LYMPH NODES: no adenopathy palpable NEUROLOGIC: alert, oriented, normal speech, no focal findings or movement disorder noted  PELVIC EXAM EXTERNAL GENITALIA: normal appearing  vulva with no masses, tenderness or lesions  ASSESSMENT: Normal 11 weeks pregnacy Obesity Previous 34 week schedule c/s- desires repeat  PLAN: Prenatal care See orders Panarama and horizons obtained

## 2014-12-30 ENCOUNTER — Telehealth: Payer: Self-pay | Admitting: *Deleted

## 2014-12-30 ENCOUNTER — Other Ambulatory Visit: Payer: Self-pay | Admitting: Obstetrics and Gynecology

## 2014-12-30 LAB — HEMOGLOBIN A1C
Est. average glucose Bld gHb Est-mCnc: 97 mg/dL
HEMOGLOBIN A1C: 5 % (ref 4.8–5.6)

## 2014-12-30 LAB — THYROID PANEL WITH TSH
Free Thyroxine Index: 2.2 (ref 1.2–4.9)
T3 Uptake Ratio: 18 % — ABNORMAL LOW (ref 24–39)
T4, Total: 12 ug/dL (ref 4.5–12.0)
TSH: 1.21 u[IU]/mL (ref 0.450–4.500)

## 2014-12-30 LAB — GLUCOSE, RANDOM: Glucose: 89 mg/dL (ref 65–99)

## 2014-12-30 NOTE — Telephone Encounter (Signed)
-----   Message from Ulyses Amor, [place] sent at 12/30/2014  3:23 PM EDT ----- Please let her know glucose was normal

## 2014-12-30 NOTE — Telephone Encounter (Signed)
Left detailed message about patients lab results

## 2015-01-06 ENCOUNTER — Encounter: Payer: Self-pay | Admitting: Obstetrics and Gynecology

## 2015-01-07 ENCOUNTER — Other Ambulatory Visit: Payer: Self-pay | Admitting: Obstetrics and Gynecology

## 2015-01-13 ENCOUNTER — Encounter: Payer: Self-pay | Admitting: Obstetrics and Gynecology

## 2015-01-26 ENCOUNTER — Ambulatory Visit (INDEPENDENT_AMBULATORY_CARE_PROVIDER_SITE_OTHER): Payer: BLUE CROSS/BLUE SHIELD | Admitting: Obstetrics and Gynecology

## 2015-01-26 ENCOUNTER — Encounter: Payer: Self-pay | Admitting: Obstetrics and Gynecology

## 2015-01-26 VITALS — BP 113/62 | HR 92 | Wt 193.1 lb

## 2015-01-26 DIAGNOSIS — L309 Dermatitis, unspecified: Secondary | ICD-10-CM | POA: Insufficient documentation

## 2015-01-26 DIAGNOSIS — Z3492 Encounter for supervision of normal pregnancy, unspecified, second trimester: Secondary | ICD-10-CM | POA: Diagnosis not present

## 2015-01-26 LAB — POCT URINALYSIS DIPSTICK
Bilirubin, UA: NEGATIVE
Blood, UA: NEGATIVE
Glucose, UA: NEGATIVE
KETONES UA: NEGATIVE
Nitrite, UA: NEGATIVE
PH UA: 6
SPEC GRAV UA: 1.015
UROBILINOGEN UA: 0.2

## 2015-01-26 MED ORDER — INFLUENZA VAC SPLIT QUAD 0.5 ML IM SUSY
0.5000 mL | PREFILLED_SYRINGE | Freq: Once | INTRAMUSCULAR | Status: AC
  Administered 2015-01-26: 0.5 mL via INTRAMUSCULAR

## 2015-01-26 MED ORDER — TRIAMCINOLONE ACETONIDE 0.025 % EX OINT: 1.0000 "application " | TOPICAL_OINTMENT | Freq: Two times a day (BID) | CUTANEOUS | Status: AC

## 2015-01-26 NOTE — Patient Instructions (Signed)
Second Trimester of Pregnancy The second trimester is from week 13 through week 28, months 4 through 6. The second trimester is often a time when you feel your best. Your body has also adjusted to being pregnant, and you begin to feel better physically. Usually, morning sickness has lessened or quit completely, you may have more energy, and you may have an increase in appetite. The second trimester is also a time when the fetus is growing rapidly. At the end of the sixth month, the fetus is about 9 inches long and weighs about 1 pounds. You will likely begin to feel the baby move (quickening) between 18 and 20 weeks of the pregnancy. BODY CHANGES Your body goes through many changes during pregnancy. The changes vary from woman to woman.   Your weight will continue to increase. You will notice your lower abdomen bulging out.  You may begin to get stretch marks on your hips, abdomen, and breasts.  You may develop headaches that can be relieved by medicines approved by your health care provider.  You may urinate more often because the fetus is pressing on your bladder.  You may develop or continue to have heartburn as a result of your pregnancy.  You may develop constipation because certain hormones are causing the muscles that push waste through your intestines to slow down.  You may develop hemorrhoids or swollen, bulging veins (varicose veins).  You may have back pain because of the weight gain and pregnancy hormones relaxing your joints between the bones in your pelvis and as a result of a shift in weight and the muscles that support your balance.  Your breasts will continue to grow and be tender.  Your gums may bleed and may be sensitive to brushing and flossing.  Dark spots or blotches (chloasma, mask of pregnancy) may develop on your face. This will likely fade after the baby is born.  A dark line from your belly button to the pubic area (linea nigra) may appear. This will likely fade  after the baby is born.  You may have changes in your hair. These can include thickening of your hair, rapid growth, and changes in texture. Some women also have hair loss during or after pregnancy, or hair that feels dry or thin. Your hair will most likely return to normal after your baby is born. WHAT TO EXPECT AT YOUR PRENATAL VISITS During a routine prenatal visit:  You will be weighed to make sure you and the fetus are growing normally.  Your blood pressure will be taken.  Your abdomen will be measured to track your baby's growth.  The fetal heartbeat will be listened to.  Any test results from the previous visit will be discussed. Your health care provider may ask you:  How you are feeling.  If you are feeling the baby move.  If you have had any abnormal symptoms, such as leaking fluid, bleeding, severe headaches, or abdominal cramping.  If you are using any tobacco products, including cigarettes, chewing tobacco, and electronic cigarettes.  If you have any questions. Other tests that may be performed during your second trimester include:  Blood tests that check for:  Low iron levels (anemia).  Gestational diabetes (between 24 and 28 weeks).  Rh antibodies.  Urine tests to check for infections, diabetes, or protein in the urine.  An ultrasound to confirm the proper growth and development of the baby.  An amniocentesis to check for possible genetic problems.  Fetal screens for spina bifida   and Down syndrome.  HIV (human immunodeficiency virus) testing. Routine prenatal testing includes screening for HIV, unless you choose not to have this test. HOME CARE INSTRUCTIONS   Avoid all smoking, herbs, alcohol, and unprescribed drugs. These chemicals affect the formation and growth of the baby.  Do not use any tobacco products, including cigarettes, chewing tobacco, and electronic cigarettes. If you need help quitting, ask your health care provider. You may receive  counseling support and other resources to help you quit.  Follow your health care provider's instructions regarding medicine use. There are medicines that are either safe or unsafe to take during pregnancy.  Exercise only as directed by your health care provider. Experiencing uterine cramps is a good sign to stop exercising.  Continue to eat regular, healthy meals.  Wear a good support bra for breast tenderness.  Do not use hot tubs, steam rooms, or saunas.  Wear your seat belt at all times when driving.  Avoid raw meat, uncooked cheese, cat litter boxes, and soil used by cats. These carry germs that can cause birth defects in the baby.  Take your prenatal vitamins.  Take 1500-2000 mg of calcium daily starting at the 20th week of pregnancy until you deliver your baby.  Try taking a stool softener (if your health care provider approves) if you develop constipation. Eat more high-fiber foods, such as fresh vegetables or fruit and whole grains. Drink plenty of fluids to keep your urine clear or pale yellow.  Take warm sitz baths to soothe any pain or discomfort caused by hemorrhoids. Use hemorrhoid cream if your health care provider approves.  If you develop varicose veins, wear support hose. Elevate your feet for 15 minutes, 3-4 times a day. Limit salt in your diet.  Avoid heavy lifting, wear low heel shoes, and practice good posture.  Rest with your legs elevated if you have leg cramps or low back pain.  Visit your dentist if you have not gone yet during your pregnancy. Use a soft toothbrush to brush your teeth and be gentle when you floss.  A sexual relationship may be continued unless your health care provider directs you otherwise.  Continue to go to all your prenatal visits as directed by your health care provider. SEEK MEDICAL CARE IF:   You have dizziness.  You have mild pelvic cramps, pelvic pressure, or nagging pain in the abdominal area.  You have persistent nausea,  vomiting, or diarrhea.  You have a bad smelling vaginal discharge.  You have pain with urination. SEEK IMMEDIATE MEDICAL CARE IF:   You have a fever.  You are leaking fluid from your vagina.  You have spotting or bleeding from your vagina.  You have severe abdominal cramping or pain.  You have rapid weight gain or loss.  You have shortness of breath with chest pain.  You notice sudden or extreme swelling of your face, hands, ankles, feet, or legs.  You have not felt your baby move in over an hour.  You have severe headaches that do not go away with medicine.  You have vision changes.   This information is not intended to replace advice given to you by your health care provider. Make sure you discuss any questions you have with your health care provider.   Document Released: 04/03/2001 Document Revised: 04/30/2014 Document Reviewed: 06/10/2012 Elsevier Interactive Patient Education 2016 Elsevier Inc. Influenza Virus Vaccine injection (Fluarix) What is this medicine? INFLUENZA VIRUS VACCINE (in floo EN zuh VAHY ruhs vak SEEN) helps to  reduce the risk of getting influenza also known as the flu. This medicine may be used for other purposes; ask your health care provider or pharmacist if you have questions. What should I tell my health care provider before I take this medicine? They need to know if you have any of these conditions: -bleeding disorder like hemophilia -fever or infection -Guillain-Barre syndrome or other neurological problems -immune system problems -infection with the human immunodeficiency virus (HIV) or AIDS -low blood platelet counts -multiple sclerosis -an unusual or allergic reaction to influenza virus vaccine, eggs, chicken proteins, latex, gentamicin, other medicines, foods, dyes or preservatives -pregnant or trying to get pregnant -breast-feeding How should I use this medicine? This vaccine is for injection into a muscle. It is given by a health  care professional. A copy of Vaccine Information Statements will be given before each vaccination. Read this sheet carefully each time. The sheet may change frequently. Talk to your pediatrician regarding the use of this medicine in children. Special care may be needed. Overdosage: If you think you have taken too much of this medicine contact a poison control center or emergency room at once. NOTE: This medicine is only for you. Do not share this medicine with others. What if I miss a dose? This does not apply. What may interact with this medicine? -chemotherapy or radiation therapy -medicines that lower your immune system like etanercept, anakinra, infliximab, and adalimumab -medicines that treat or prevent blood clots like warfarin -phenytoin -steroid medicines like prednisone or cortisone -theophylline -vaccines This list may not describe all possible interactions. Give your health care provider a list of all the medicines, herbs, non-prescription drugs, or dietary supplements you use. Also tell them if you smoke, drink alcohol, or use illegal drugs. Some items may interact with your medicine. What should I watch for while using this medicine? Report any side effects that do not go away within 3 days to your doctor or health care professional. Call your health care provider if any unusual symptoms occur within 6 weeks of receiving this vaccine. You may still catch the flu, but the illness is not usually as bad. You cannot get the flu from the vaccine. The vaccine will not protect against colds or other illnesses that may cause fever. The vaccine is needed every year. What side effects may I notice from receiving this medicine? Side effects that you should report to your doctor or health care professional as soon as possible: -allergic reactions like skin rash, itching or hives, swelling of the face, lips, or tongue Side effects that usually do not require medical attention (report to your  doctor or health care professional if they continue or are bothersome): -fever -headache -muscle aches and pains -pain, tenderness, redness, or swelling at site where injected -weak or tired This list may not describe all possible side effects. Call your doctor for medical advice about side effects. You may report side effects to FDA at 1-800-FDA-1088. Where should I keep my medicine? This vaccine is only given in a clinic, pharmacy, doctor's office, or other health care setting and will not be stored at home. NOTE: This sheet is a summary. It may not cover all possible information. If you have questions about this medicine, talk to your doctor, pharmacist, or health care provider.    2016, Elsevier/Gold Standard. (2007-11-05 09:30:40)

## 2015-01-26 NOTE — Progress Notes (Signed)
ROB- flu vaccine today; eczema flared up on chest, antecubital and behind knees- new rx sent in for steroid cream

## 2015-01-26 NOTE — Progress Notes (Signed)
ROB- states she has noticed some morning sickness, has been gagging in the am

## 2015-02-23 ENCOUNTER — Ambulatory Visit: Payer: BLUE CROSS/BLUE SHIELD

## 2015-02-23 DIAGNOSIS — Z3492 Encounter for supervision of normal pregnancy, unspecified, second trimester: Secondary | ICD-10-CM | POA: Diagnosis not present

## 2015-03-02 ENCOUNTER — Other Ambulatory Visit: Payer: Self-pay | Admitting: Obstetrics and Gynecology

## 2015-03-06 LAB — URINE CULTURE, OB REFLEX

## 2015-03-06 LAB — CULTURE, OB URINE

## 2015-03-08 ENCOUNTER — Other Ambulatory Visit: Payer: Self-pay | Admitting: Obstetrics and Gynecology

## 2015-03-08 ENCOUNTER — Telehealth: Payer: Self-pay | Admitting: *Deleted

## 2015-03-08 DIAGNOSIS — R8271 Bacteriuria: Secondary | ICD-10-CM | POA: Insufficient documentation

## 2015-03-08 MED ORDER — AMOXICILLIN 500 MG PO CAPS: 500.0000 mg | ORAL_CAPSULE | Freq: Three times a day (TID) | ORAL | Status: DC

## 2015-03-08 NOTE — Telephone Encounter (Signed)
-----   Message from Ulyses AmorMelody N Burr, [place] sent at 03/08/2015  9:51 AM EST ----- Please let her know her urine was + for GBS UTI- I sent in antibiotics and she is to take 1 pill three times a day for 1 week, also may need to add daily yogurt to prevent a yeast infection.

## 2015-03-08 NOTE — Telephone Encounter (Signed)
Notified pt of results 

## 2015-03-09 ENCOUNTER — Encounter: Payer: Self-pay | Admitting: Obstetrics and Gynecology

## 2015-03-09 ENCOUNTER — Ambulatory Visit: Payer: BLUE CROSS/BLUE SHIELD

## 2015-03-09 ENCOUNTER — Ambulatory Visit (INDEPENDENT_AMBULATORY_CARE_PROVIDER_SITE_OTHER): Payer: BLUE CROSS/BLUE SHIELD | Admitting: Obstetrics and Gynecology

## 2015-03-09 ENCOUNTER — Other Ambulatory Visit: Payer: Self-pay | Admitting: Obstetrics and Gynecology

## 2015-03-09 VITALS — BP 116/56 | HR 88 | Wt 197.2 lb

## 2015-03-09 DIAGNOSIS — IMO0002 Reserved for concepts with insufficient information to code with codable children: Secondary | ICD-10-CM

## 2015-03-09 DIAGNOSIS — Z0489 Encounter for examination and observation for other specified reasons: Secondary | ICD-10-CM

## 2015-03-09 DIAGNOSIS — Z3492 Encounter for supervision of normal pregnancy, unspecified, second trimester: Secondary | ICD-10-CM

## 2015-03-09 DIAGNOSIS — Z36 Encounter for antenatal screening of mother: Secondary | ICD-10-CM | POA: Diagnosis not present

## 2015-03-09 LAB — POCT URINALYSIS DIPSTICK
Bilirubin, UA: NEGATIVE
Blood, UA: NEGATIVE
GLUCOSE UA: NEGATIVE
KETONES UA: NEGATIVE
Nitrite, UA: NEGATIVE
PROTEIN UA: NEGATIVE
Spec Grav, UA: 1.015
UROBILINOGEN UA: 0.2
pH, UA: 6.5

## 2015-03-09 NOTE — Progress Notes (Signed)
ROB-pt is c/o lots of pressure, she feels its in her legs

## 2015-03-09 NOTE — Progress Notes (Signed)
Indications:  F/U Anatomy for anatomy not seen on prior ultrasound Findings:  Singleton intrauterine pregnancy is visualized with FHR at 144 BPM.   Fetal presentation is vertex, spine left lateral. Placenta: Anterior, grade 1. AFI: Subjectively adequate  Anatomic survey is now complete with acquisition of all spine views, kidneys, aortic arch and ductus arch, and all appear normal.  Impression: 1. Anatomy scan is now complete.

## 2015-03-10 ENCOUNTER — Other Ambulatory Visit: Payer: Self-pay | Admitting: Obstetrics and Gynecology

## 2015-03-11 LAB — URINE CULTURE: Organism ID, Bacteria: NO GROWTH

## 2015-04-06 ENCOUNTER — Ambulatory Visit (INDEPENDENT_AMBULATORY_CARE_PROVIDER_SITE_OTHER): Payer: BLUE CROSS/BLUE SHIELD | Admitting: Obstetrics and Gynecology

## 2015-04-06 ENCOUNTER — Encounter: Payer: Self-pay | Admitting: Obstetrics and Gynecology

## 2015-04-06 VITALS — BP 116/61 | HR 96 | Wt 201.0 lb

## 2015-04-06 DIAGNOSIS — Z3492 Encounter for supervision of normal pregnancy, unspecified, second trimester: Secondary | ICD-10-CM

## 2015-04-06 LAB — POCT URINALYSIS DIPSTICK
BILIRUBIN UA: NEGATIVE
Blood, UA: NEGATIVE
Glucose, UA: NEGATIVE
KETONES UA: NEGATIVE
NITRITE UA: NEGATIVE
PH UA: 6
Spec Grav, UA: 1.015
Urobilinogen, UA: 0.2

## 2015-04-06 NOTE — Progress Notes (Signed)
ROB- pt states she has had some dizzy spells this week, does have some sinus drainage

## 2015-04-06 NOTE — Progress Notes (Signed)
ROB- doing well, glucola next visit. Plans to name infant " Buel ReamLillie Elizabeth" , desires repeat c/s- will see Dr Valentino Saxonherry at 32 weeks to discuss.

## 2015-04-24 NOTE — L&D Delivery Note (Addendum)
Delivery Summary for Sharon Warren  Labor Events:   Preterm labor:   Rupture date:   Rupture time:   Rupture type:   Fluid Color:   Induction:   Augmentation:   Complications:   Cervical ripening:          Delivery:   Episiotomy:   Lacerations:   Repair suture:   Repair # of packets:   Blood loss (ml):  450 ml   Information for the patient's newborn:  Sharon Warren, Sharon Warren [[id number]][[id number]]    Delivery 07/15/2015 2:06 PM by  C-Section, Low Vertical Sex:  female Gestational Age: [id number] Delivery Clinician:  Hildred LaserAnika Labrisha Wuellner Living?: Yes        APGARS  One minute Five minutes Ten minutes  Skin color: 0   1      Heart rate: 2   2      Grimace: 2   2      Muscle tone: 2   2      Breathing: 2   2      Totals: 8  9      Presentation/position:      Resuscitation:   Cord information:    Disposition of cord blood:     Blood gases sent?  Complications:   Placenta: Delivered: 07/15/2015 2:11 PM  Manual removal  Intact appearance Newborn Measurements: Weight: 8 lb 12.7 oz (3990 g)  Height: 20.87"  Head circumference:    Chest circumference:    Other providers: Registered Nurse Midwife Transition RN Irwin BrakemanKelly A Yates Melody N Shambley Jannette SpannerSara T Myers  Additional  information: Forceps:   Vacuum:   Breech:   Observed anomalies        See Cesarean Section operative note by Dr. Valentino Saxonherry for details of procedure.   Hildred LaserAnika Torunn Chancellor, MD [facility]

## 2015-04-26 ENCOUNTER — Other Ambulatory Visit: Payer: Self-pay | Admitting: *Deleted

## 2015-04-26 ENCOUNTER — Telehealth: Payer: Self-pay | Admitting: Obstetrics and Gynecology

## 2015-04-26 MED ORDER — FLUCONAZOLE 150 MG PO TABS: 150.0000 mg | ORAL_TABLET | Freq: Once | ORAL | Status: DC

## 2015-04-26 NOTE — Telephone Encounter (Signed)
Spoke with pt

## 2015-04-26 NOTE — Telephone Encounter (Signed)
PT CALLED AND THINKS SHE HAS A YEAST INFECTION, IT HAS BEEN GOING ON FOR ABOUT 2 WEEKS, SHE HAS NOT TRIED MONISTATE DUE TO ABOUT 5 YEARS AGO HAD A REACTION TO IT, SO SHE WANTED TO KNOW WHAT SHE NEEDS TO DO, PT DOES HAVE AN APPT THIS Friday BUT I THINK IT MIGHT BE JUST FOR A GLUCOSE, NOT SURE THOUGH. YOU CAN CALL HER BACK AT HER WORK NUMBER [phone]

## 2015-04-27 ENCOUNTER — Telehealth: Payer: Self-pay | Admitting: Obstetrics and Gynecology

## 2015-04-27 ENCOUNTER — Ambulatory Visit (INDEPENDENT_AMBULATORY_CARE_PROVIDER_SITE_OTHER): Payer: BLUE CROSS/BLUE SHIELD | Admitting: Obstetrics and Gynecology

## 2015-04-27 ENCOUNTER — Encounter: Payer: Self-pay | Admitting: Obstetrics and Gynecology

## 2015-04-27 VITALS — BP 118/65 | HR 90 | Wt 203.9 lb

## 2015-04-27 DIAGNOSIS — R7309 Other abnormal glucose: Secondary | ICD-10-CM

## 2015-04-27 DIAGNOSIS — Z331 Pregnant state, incidental: Secondary | ICD-10-CM

## 2015-04-27 LAB — POCT URINALYSIS DIPSTICK
BILIRUBIN UA: NEGATIVE
Glucose, UA: NEGATIVE
KETONES UA: NEGATIVE
NITRITE UA: NEGATIVE
PH UA: 6
PROTEIN UA: NEGATIVE
RBC UA: NEGATIVE
Spec Grav, UA: 1.01
Urobilinogen, UA: 0.2

## 2015-04-27 NOTE — Telephone Encounter (Signed)
Pt called and meds were called in for, but she has some questions and is requesting a call back from you, she said you can call her on her cell first and then her work if you don't get her. Her work number is [phone]

## 2015-04-27 NOTE — Progress Notes (Signed)
OB WORK IN-c/o lots of d/c some odor, had some vaginal itching yesterday

## 2015-04-27 NOTE — Progress Notes (Signed)
Problem OB- reports vaginal itching with increased yellow thick d/c x 10 days, took prescribed diflucan yesterday and felt much better, but had gush of thin green vaginal fluid this am-  Pelvic exam: normal external genitalia, vulva, vagina, cervix, uterus and adnexa, VULVA: normal appearing vulva with no masses, tenderness or lesions, vulvar erythema throughout, WET MOUNT done - results: lactobacilli scant.  Instructed to continue yogurt daily and if symptoms return OK to take another Diflucan in 3 days.

## 2015-04-27 NOTE — Telephone Encounter (Signed)
Pt came in 04/27/15

## 2015-04-29 ENCOUNTER — Ambulatory Visit (INDEPENDENT_AMBULATORY_CARE_PROVIDER_SITE_OTHER): Payer: BLUE CROSS/BLUE SHIELD | Admitting: Obstetrics and Gynecology

## 2015-04-29 ENCOUNTER — Encounter: Payer: Self-pay | Admitting: Obstetrics and Gynecology

## 2015-04-29 VITALS — BP 103/65 | HR 94 | Wt 203.4 lb

## 2015-04-29 DIAGNOSIS — Z131 Encounter for screening for diabetes mellitus: Secondary | ICD-10-CM

## 2015-04-29 DIAGNOSIS — Z23 Encounter for immunization: Secondary | ICD-10-CM | POA: Diagnosis not present

## 2015-04-29 DIAGNOSIS — Z331 Pregnant state, incidental: Secondary | ICD-10-CM

## 2015-04-29 LAB — URINE CULTURE: ORGANISM ID, BACTERIA: NO GROWTH

## 2015-04-29 LAB — POCT URINALYSIS DIPSTICK
Blood, UA: NEGATIVE
Glucose, UA: NEGATIVE
KETONES UA: NEGATIVE
Leukocytes, UA: NEGATIVE
Nitrite, UA: NEGATIVE
PH UA: 6
SPEC GRAV UA: 1.01
Urobilinogen, UA: 0.2

## 2015-04-29 MED ORDER — TETANUS-DIPHTH-ACELL PERTUSSIS 5-2.5-18.5 LF-MCG/0.5 IM SUSP
0.5000 mL | Freq: Once | INTRAMUSCULAR | Status: AC
  Administered 2015-04-29: 0.5 mL via INTRAMUSCULAR

## 2015-04-29 NOTE — Progress Notes (Signed)
ROB & Glucola-doing well except leg cramps at night- to add MagOx prn.  Info on cord blood donation.

## 2015-04-29 NOTE — Patient Instructions (Signed)
Sterilization Information, Female  Female sterilization is a procedure to permanently prevent pregnancy. There are different ways to perform sterilization, but all either block or close the fallopian tubes so that your eggs cannot reach your uterus. If your egg cannot reach your uterus, sperm cannot fertilize the egg, and you cannot get pregnant.   Sterilization is performed by a surgical procedure. Sometimes these procedures are performed in a hospital while a patient is asleep. Sometimes they can be done in a clinic setting with the patient awake. The fallopian tubes can be surgically cut, tied, or sealed through a procedure called tubal ligation. The fallopian tubes can also be closed with clips or rings. Sterilization can also be done by placing a tiny coil into each fallopian tube, which causes scar tissue to grow inside the tube. The scar tissue then blocks the tubes.   Discuss sterilization with your caregiver to answer any concerns you or your partner may have. You may want to ask what type of sterilization your caregiver performs. Some caregivers may not perform all the various options. Sterilization is permanent and should only be done if you are sure you do not want children or do not want any more children. Having a sterilization reversed may not be successful.   STERILIZATION PROCEDURES  · Laparoscopic sterilization. This is a surgical method performed at a time other than right after childbirth. Two incisions are made in the lower abdomen. A thin, lighted tube (laparoscope) is inserted into one of the incisions and is used to perform the procedure. The fallopian tubes are closed with a ring or a clip. An instrument that uses heat could be used to seal the tubes closed (electrocautery).    · Mini-laparotomy. This is a surgical method done 1 or 2 days after giving birth. Typically, a small incision is made just below the belly button (umbilicus) and the fallopian tubes are exposed. The tubes can then be  sealed, tied, or cut.    · Hysteroscopic sterilization. This is performed at a time other than right after childbirth. A tiny, spring-like coil is inserted through the cervix and uterus and placed into the fallopian tubes. The coil causes scaring and blocks the tubes. Other forms of contraception should be used for 3 months after the procedure to allow the scar tissue to form completely. Additionally, it is required hysterosalpingography be done 3 months later to ensure that the procedure was successful.  Hysterosalpingography is a procedure that uses X-rays to look at your uterus and fallopian tubes after a material to make them show up better has been inserted.  IS STERILIZATION SAFE?  Sterilization is considered safe with very rare complications. Risks depend on the type of procedure you have. As with any surgical procedure, there are risks. Some risks of sterilization by any means include:   · Bleeding.  · Infection.  · Reaction to anesthesia medicine.  · Injury to surrounding organs.  Risks specific to having hysteroscopic coils placed include:  · The coils may not be placed correctly the first time.     · The coils may move out of place.    · The tubes may not get completely blocked after 3 months.    · Injury to surrounding organs when placing the coil.    HOW EFFECTIVE IS FEMALE STERILIZATION?  Sterilization is nearly 100% effective, but it can fail. Depending on the type of sterilization, the rate of failure can be as high as 3%. After hysteroscopic sterilization with placement of fallopian tube coils, you will need back-up birth   control for 3 months after the procedure. Sterilization is effective for a lifetime.   BENEFITS OF STERILIZATION  · It does not affect your hormones, and therefore will not affect your menstrual periods, sexual desire, or performance.    · It is effective for a lifetime.    · It is safe.    · You do not need to worry about getting pregnant. Keep in mind that if you had the  hysteroscopic placement procedure, you must wait 3 months after the procedure (or until your caregiver confirms) before pregnancy is not considered possible.    · There are no side effects unlike other types of birth control (contraception).    DRAWBACKS OF STERILIZATION  · You must be sure you do not want children or any more children. The procedure is permanent.    · It does not provide protection against sexually transmitted infections (STIs).    · The tubes can grow back together. If this happens, there is a risk of pregnancy. There is also an increased risk (50%) of pregnancy being an ectopic pregnancy. This is a pregnancy that happens outside of the uterus.     This information is not intended to replace advice given to you by your health care provider. Make sure you discuss any questions you have with your health care provider.     Document Released: 09/26/2007 Document Revised: 04/14/2013 Document Reviewed: 07/26/2011  Elsevier Interactive Patient Education ©2016 Elsevier Inc.

## 2015-04-29 NOTE — Progress Notes (Signed)
ROB-glucola done, blood consent signed, tdap given Pt is feeling much better from her last ov

## 2015-04-30 ENCOUNTER — Other Ambulatory Visit: Payer: Self-pay | Admitting: Obstetrics and Gynecology

## 2015-04-30 DIAGNOSIS — D649 Anemia, unspecified: Secondary | ICD-10-CM

## 2015-04-30 DIAGNOSIS — R7309 Other abnormal glucose: Secondary | ICD-10-CM | POA: Insufficient documentation

## 2015-04-30 LAB — HEMOGLOBIN AND HEMATOCRIT, BLOOD
Hematocrit: 28.4 % — ABNORMAL LOW (ref 34.0–46.6)
Hemoglobin: 9.7 g/dL — ABNORMAL LOW (ref 11.1–15.9)

## 2015-04-30 LAB — GLUCOSE, 1 HOUR GESTATIONAL: Gestational Diabetes Screen: 167 mg/dL — ABNORMAL HIGH (ref 65–139)

## 2015-04-30 MED ORDER — FUSION PLUS PO CAPS: 1.0000 | ORAL_CAPSULE | Freq: Every day | ORAL | Status: DC

## 2015-05-02 NOTE — Telephone Encounter (Signed)
-----   Message from Purcell NailsMelody N Shambley, [place] sent at 04/30/2015 11:03 AM EST ----- Please make sure she comes in this week for 3 h GTT

## 2015-05-02 NOTE — Telephone Encounter (Signed)
Notified pt of results, she is coming in 05/03/14 for 3 hr

## 2015-05-04 ENCOUNTER — Other Ambulatory Visit: Payer: BLUE CROSS/BLUE SHIELD

## 2015-05-04 ENCOUNTER — Other Ambulatory Visit: Payer: Self-pay | Admitting: Obstetrics and Gynecology

## 2015-05-05 LAB — CBC
HEMOGLOBIN: 9.7 g/dL — AB (ref 11.1–15.9)
Hematocrit: 29.1 % — ABNORMAL LOW (ref 34.0–46.6)
MCH: 29.8 pg (ref 26.6–33.0)
MCHC: 33.3 g/dL (ref 31.5–35.7)
MCV: 90 fL (ref 79–97)
Platelets: 144 10*3/uL — ABNORMAL LOW (ref 150–379)
RBC: 3.25 x10E6/uL — AB (ref 3.77–5.28)
RDW: 13.8 % (ref 12.3–15.4)
WBC: 10.8 10*3/uL (ref 3.4–10.8)

## 2015-05-05 LAB — GESTATIONAL GLUCOSE TOLERANCE
GLUCOSE 1 HOUR GTT: 160 mg/dL (ref 65–179)
GLUCOSE 2 HOUR GTT: 105 mg/dL (ref 65–154)
GLUCOSE 3 HOUR GTT: 96 mg/dL (ref 65–139)
GLUCOSE FASTING: 86 mg/dL (ref 65–94)

## 2015-05-13 ENCOUNTER — Encounter: Payer: Self-pay | Admitting: Obstetrics and Gynecology

## 2015-05-13 ENCOUNTER — Ambulatory Visit (INDEPENDENT_AMBULATORY_CARE_PROVIDER_SITE_OTHER): Payer: BLUE CROSS/BLUE SHIELD | Admitting: Obstetrics and Gynecology

## 2015-05-13 VITALS — BP 105/65 | HR 95 | Wt 206.4 lb

## 2015-05-13 DIAGNOSIS — Z331 Pregnant state, incidental: Secondary | ICD-10-CM

## 2015-05-13 LAB — POCT URINALYSIS DIPSTICK
Bilirubin, UA: NEGATIVE
Glucose, UA: NEGATIVE
Ketones, UA: NEGATIVE
Leukocytes, UA: NEGATIVE
Nitrite, UA: NEGATIVE
PH UA: 6
RBC UA: NEGATIVE
SPEC GRAV UA: 1.015
UROBILINOGEN UA: 0.2

## 2015-05-13 NOTE — Progress Notes (Signed)
ROB-pt is having some SOB, otherwise no complaints

## 2015-05-13 NOTE — Progress Notes (Signed)
ROB- plans cord blood donation, will see Dr Valentino Saxon next visit for pre-op.

## 2015-05-25 ENCOUNTER — Ambulatory Visit (INDEPENDENT_AMBULATORY_CARE_PROVIDER_SITE_OTHER): Payer: BLUE CROSS/BLUE SHIELD | Admitting: Obstetrics and Gynecology

## 2015-05-25 VITALS — BP 116/74 | HR 99 | Wt 210.9 lb

## 2015-05-25 DIAGNOSIS — Z3493 Encounter for supervision of normal pregnancy, unspecified, third trimester: Secondary | ICD-10-CM

## 2015-05-25 DIAGNOSIS — O34219 Maternal care for unspecified type scar from previous cesarean delivery: Secondary | ICD-10-CM

## 2015-05-25 DIAGNOSIS — D649 Anemia, unspecified: Secondary | ICD-10-CM

## 2015-05-25 DIAGNOSIS — Z3490 Encounter for supervision of normal pregnancy, unspecified, unspecified trimester: Secondary | ICD-10-CM | POA: Insufficient documentation

## 2015-05-25 DIAGNOSIS — Z3483 Encounter for supervision of other normal pregnancy, third trimester: Secondary | ICD-10-CM

## 2015-05-25 LAB — POCT URINALYSIS DIPSTICK
BILIRUBIN UA: NEGATIVE
Blood, UA: NEGATIVE
GLUCOSE UA: NEGATIVE
Ketones, UA: NEGATIVE
NITRITE UA: NEGATIVE
Spec Grav, UA: 1.015
Urobilinogen, UA: NEGATIVE
pH, UA: 6.5

## 2015-05-25 NOTE — Progress Notes (Signed)
ROB: Consult from [facility] for planned repeat C-section.  H/o prior C-section x 1 (performed for fetal complications at 34 weeks). Denies complaints. Also reports desiring BTL.  Discussed repeat C-section vs TOLAC.  Patient declines TOLAC. Also counseled on BTL. Other reversible forms of contraception were discussed with patient; she declines all other modalities. Risks of procedure discussed with patient including but not limited to: risk of regret, permanence of method, bleeding, infection, injury to surrounding organs and need for additional procedures.  Failure risk of 1-2 % with increased risk of ectopic gestation if pregnancy occurs was also discussed with patient.  Patient verbalized understanding of these risks and wants to proceed with sterilization.  Will schedule for BTL at time of C-section.  Encouraged adherence to iron supplementation as patient is currently anemic.  To f/u with [facility] in 2 weeks until 38 weeks.  To be seen by MD to perform pre-op at that time.

## 2015-05-26 ENCOUNTER — Telehealth: Payer: Self-pay | Admitting: Obstetrics and Gynecology

## 2015-05-26 NOTE — Telephone Encounter (Signed)
She saw dr Valentino Saxon yesterday for c-section consult and she has some questions.

## 2015-05-26 NOTE — Telephone Encounter (Signed)
Spoke with pt she was worried [facility] wouldn't be there for her c-section, reassured pt voiced understanding

## 2015-06-01 ENCOUNTER — Telehealth: Payer: Self-pay | Admitting: Obstetrics and Gynecology

## 2015-06-01 NOTE — Telephone Encounter (Signed)
Patient called, she is [redacted] weeks pregnant and thinks she has a sinus infection and didn't know if something can be called in or if she needs to go to urgent care. Please Advise

## 2015-06-01 NOTE — Telephone Encounter (Signed)
Patient notified and scheduled for appt 

## 2015-06-01 NOTE — Telephone Encounter (Signed)
Will need to be seen

## 2015-06-01 NOTE — Telephone Encounter (Signed)
pls advise

## 2015-06-03 ENCOUNTER — Ambulatory Visit (INDEPENDENT_AMBULATORY_CARE_PROVIDER_SITE_OTHER): Payer: BLUE CROSS/BLUE SHIELD | Admitting: Obstetrics and Gynecology

## 2015-06-03 ENCOUNTER — Encounter: Payer: Self-pay | Admitting: Obstetrics and Gynecology

## 2015-06-03 VITALS — BP 109/68 | HR 92 | Temp 99.1°F | Wt 207.7 lb

## 2015-06-03 DIAGNOSIS — Z331 Pregnant state, incidental: Secondary | ICD-10-CM

## 2015-06-03 DIAGNOSIS — J0100 Acute maxillary sinusitis, unspecified: Secondary | ICD-10-CM

## 2015-06-03 DIAGNOSIS — H6092 Unspecified otitis externa, left ear: Secondary | ICD-10-CM

## 2015-06-03 LAB — POCT URINALYSIS DIPSTICK
BILIRUBIN UA: NEGATIVE
Blood, UA: NEGATIVE
GLUCOSE UA: NEGATIVE
KETONES UA: 5
LEUKOCYTES UA: NEGATIVE
Nitrite, UA: NEGATIVE
PH UA: 6
Spec Grav, UA: 1.015
Urobilinogen, UA: 0.2

## 2015-06-03 MED ORDER — NEOMYCIN-POLYMYXIN-HC 1 % OT SOLN: 3.0000 [drp] | Freq: Four times a day (QID) | OTIC | Status: DC

## 2015-06-03 MED ORDER — CEFDINIR 300 MG PO CAPS: 300.0000 mg | ORAL_CAPSULE | Freq: Two times a day (BID) | ORAL | Status: DC

## 2015-06-03 NOTE — Progress Notes (Signed)
OB work in- c/o sinus inf x 2 weeks, facial pressure, L ear pain, cough- slight productive

## 2015-06-03 NOTE — Progress Notes (Signed)
Problem OB-c/o sinus pain and left ear pain x 1 week, gotten worse over last few days. Green sinus drainage, has low grade fever today. Left tympanic membrane boggy with redness, maxillary sinuses boggy and tender. Rx sent in for Cortisporin Otic and Omnecef bid x 7 d.

## 2015-06-08 ENCOUNTER — Telehealth: Payer: Self-pay

## 2015-06-08 ENCOUNTER — Ambulatory Visit (INDEPENDENT_AMBULATORY_CARE_PROVIDER_SITE_OTHER): Payer: BLUE CROSS/BLUE SHIELD | Admitting: Obstetrics and Gynecology

## 2015-06-08 ENCOUNTER — Other Ambulatory Visit: Payer: Self-pay | Admitting: Obstetrics and Gynecology

## 2015-06-08 VITALS — BP 117/71 | HR 96 | Wt 211.0 lb

## 2015-06-08 DIAGNOSIS — D649 Anemia, unspecified: Secondary | ICD-10-CM

## 2015-06-08 DIAGNOSIS — O26893 Other specified pregnancy related conditions, third trimester: Secondary | ICD-10-CM | POA: Diagnosis not present

## 2015-06-08 DIAGNOSIS — R102 Pelvic and perineal pain: Secondary | ICD-10-CM

## 2015-06-08 DIAGNOSIS — R350 Frequency of micturition: Secondary | ICD-10-CM

## 2015-06-08 DIAGNOSIS — N949 Unspecified condition associated with female genital organs and menstrual cycle: Secondary | ICD-10-CM | POA: Diagnosis not present

## 2015-06-08 DIAGNOSIS — R7309 Other abnormal glucose: Secondary | ICD-10-CM

## 2015-06-08 LAB — POCT URINALYSIS DIPSTICK
Bilirubin, UA: NEGATIVE
Glucose, UA: NEGATIVE
Ketones, UA: NEGATIVE
Leukocytes, UA: NEGATIVE
Nitrite, UA: NEGATIVE
PH UA: 6
PROTEIN UA: NEGATIVE
RBC UA: NEGATIVE
SPEC GRAV UA: 1.015
UROBILINOGEN UA: NEGATIVE

## 2015-06-08 NOTE — Telephone Encounter (Signed)
Has been having braxton hicks, not continuous. Today they feel like really bad menstral cramps. No vaginal bleeding. No unusual discharge. Has pressure when voids. Pt to come in this afternoon to have urine checked.

## 2015-06-08 NOTE — Progress Notes (Signed)
Pt presents for pelvic pressure, pressure when voids. Urinalysis negative.  Pt to monitor braxton hicks and if more than 6 in an hour, or vaginal bleeding to contact office per [facility].  Currently no contractions or vaginal bleeding.

## 2015-06-10 ENCOUNTER — Encounter: Payer: Self-pay | Admitting: Obstetrics and Gynecology

## 2015-06-10 ENCOUNTER — Ambulatory Visit (INDEPENDENT_AMBULATORY_CARE_PROVIDER_SITE_OTHER): Payer: BLUE CROSS/BLUE SHIELD | Admitting: Obstetrics and Gynecology

## 2015-06-10 VITALS — BP 109/74 | HR 98 | Wt 209.1 lb

## 2015-06-10 DIAGNOSIS — Z3493 Encounter for supervision of normal pregnancy, unspecified, third trimester: Secondary | ICD-10-CM

## 2015-06-10 LAB — URINE CULTURE: Organism ID, Bacteria: NO GROWTH

## 2015-06-10 LAB — POCT URINALYSIS DIPSTICK
BILIRUBIN UA: NEGATIVE
GLUCOSE UA: NEGATIVE
KETONES UA: NEGATIVE
Nitrite, UA: NEGATIVE
Protein, UA: NEGATIVE
RBC UA: NEGATIVE
SPEC GRAV UA: 1.015
Urobilinogen, UA: 0.2
pH, UA: 7

## 2015-06-10 LAB — PLEASE NOTE

## 2015-06-10 NOTE — Progress Notes (Signed)
ROB- doing better.

## 2015-06-10 NOTE — Progress Notes (Signed)
NO complaints. 

## 2015-06-23 ENCOUNTER — Other Ambulatory Visit: Payer: Self-pay | Admitting: *Deleted

## 2015-06-23 DIAGNOSIS — Z113 Encounter for screening for infections with a predominantly sexual mode of transmission: Secondary | ICD-10-CM

## 2015-06-23 DIAGNOSIS — Z3685 Encounter for antenatal screening for Streptococcus B: Secondary | ICD-10-CM

## 2015-06-23 DIAGNOSIS — Z331 Pregnant state, incidental: Secondary | ICD-10-CM

## 2015-06-24 ENCOUNTER — Encounter: Payer: Self-pay | Admitting: Obstetrics and Gynecology

## 2015-06-24 ENCOUNTER — Other Ambulatory Visit: Payer: Self-pay

## 2015-06-24 ENCOUNTER — Ambulatory Visit (INDEPENDENT_AMBULATORY_CARE_PROVIDER_SITE_OTHER): Payer: BLUE CROSS/BLUE SHIELD | Admitting: Obstetrics and Gynecology

## 2015-06-24 VITALS — BP 109/69 | HR 104 | Wt 211.0 lb

## 2015-06-24 DIAGNOSIS — Z331 Pregnant state, incidental: Secondary | ICD-10-CM

## 2015-06-24 DIAGNOSIS — Z113 Encounter for screening for infections with a predominantly sexual mode of transmission: Secondary | ICD-10-CM

## 2015-06-24 LAB — POCT URINALYSIS DIPSTICK
Glucose, UA: 250
Ketones, UA: NEGATIVE
NITRITE UA: NEGATIVE
PH UA: 7.5
RBC UA: NEGATIVE
SPEC GRAV UA: 1.015
UROBILINOGEN UA: 0.2

## 2015-06-24 NOTE — Progress Notes (Signed)
ROB- cultures obtained, pelvic pressure, low back pain 

## 2015-06-24 NOTE — Progress Notes (Signed)
ROB- doing well, aptima only obtained as she is known GBS+; repeat LTCS with BTL planned for 07/18/15

## 2015-06-27 ENCOUNTER — Other Ambulatory Visit: Payer: Self-pay

## 2015-06-27 DIAGNOSIS — Z3685 Encounter for antenatal screening for Streptococcus B: Secondary | ICD-10-CM

## 2015-06-28 ENCOUNTER — Other Ambulatory Visit: Payer: Self-pay | Admitting: Obstetrics and Gynecology

## 2015-06-28 ENCOUNTER — Telehealth: Payer: Self-pay | Admitting: Obstetrics and Gynecology

## 2015-06-28 MED ORDER — ONDANSETRON 4 MG PO TBDP: 4.0000 mg | ORAL_TABLET | Freq: Four times a day (QID) | ORAL | Status: DC | PRN

## 2015-06-28 MED ORDER — POLYETHYLENE GLYCOL 3350 17 GM/SCOOP PO POWD: 1.0000 | Freq: Once | ORAL | Status: DC

## 2015-06-28 NOTE — Telephone Encounter (Signed)
PT CALLED AND SHE THINKS SHE MIGHT HAVE CAUGHT THE STOMACH BUG, SHE WAS REALLY NAUSEATED AND HAD VOMITING YESTERDAY,  BUT IT WAS BILE, SHE DIDN'T HAVE DIARRHEA, SHE IS STILL CONSTIPATED, SHE HASN'T HAD A NORMAL BM IN ABOUT A WEEK, SHE HAS BEEN TAKING THE STOOL SOFTENER AND DOESN'T SEEM TO HELP, AND ALSO SHE THINKS SHE IS A YEAST INFECTION AGAIN AND WAS HOPING THAT [facility] COULD CALL IN A MEDICATION FOR HER. PT WOULD LIKE A CALL BACK WHEN YOU GET A CHANCE ABOUT THE OTHERS.

## 2015-06-28 NOTE — Telephone Encounter (Signed)
Please let her know I have called in prescriptions for both, zofran ODT for the nausea, and Miralax powder for constipation. I do like to treat yeast infections in pregnancy unless they are confirmed first. She is OK to use vagisil externally until next appointment.

## 2015-06-28 NOTE — Telephone Encounter (Signed)
Notified pt she voiced understanding 

## 2015-06-28 NOTE — Telephone Encounter (Signed)
pls advise

## 2015-06-30 LAB — GC/CHLAMYDIA PROBE AMP
CHLAMYDIA, DNA PROBE: NEGATIVE
NEISSERIA GONORRHOEAE BY PCR: NEGATIVE

## 2015-07-01 ENCOUNTER — Encounter: Payer: Self-pay | Admitting: Obstetrics and Gynecology

## 2015-07-01 ENCOUNTER — Ambulatory Visit (INDEPENDENT_AMBULATORY_CARE_PROVIDER_SITE_OTHER): Payer: BLUE CROSS/BLUE SHIELD | Admitting: Obstetrics and Gynecology

## 2015-07-01 VITALS — BP 109/70 | HR 104 | Wt 214.5 lb

## 2015-07-01 DIAGNOSIS — Z331 Pregnant state, incidental: Secondary | ICD-10-CM

## 2015-07-01 DIAGNOSIS — B379 Candidiasis, unspecified: Secondary | ICD-10-CM

## 2015-07-01 LAB — POCT URINALYSIS DIPSTICK
Bilirubin, UA: NEGATIVE
Blood, UA: NEGATIVE
GLUCOSE UA: NEGATIVE
Ketones, UA: NEGATIVE
LEUKOCYTES UA: NEGATIVE
NITRITE UA: NEGATIVE
PROTEIN UA: NEGATIVE
SPEC GRAV UA: 1.01
UROBILINOGEN UA: 0.2
pH, UA: 6

## 2015-07-01 MED ORDER — FLUCONAZOLE 150 MG PO TABS: 150.0000 mg | ORAL_TABLET | Freq: Once | ORAL | Status: DC

## 2015-07-01 NOTE — Progress Notes (Signed)
Rob- REPORTS YEAST S/S X 3 DAYS, Microscopic wet-mount exam shows negative for pathogens, normal epithelial cells, lactobacilli +.Rx sent in for Diflucan 150mg 

## 2015-07-01 NOTE — Progress Notes (Signed)
ROB- pt is having a lot of pelvic pressure 

## 2015-07-06 ENCOUNTER — Ambulatory Visit (INDEPENDENT_AMBULATORY_CARE_PROVIDER_SITE_OTHER): Payer: BLUE CROSS/BLUE SHIELD | Admitting: Obstetrics and Gynecology

## 2015-07-06 VITALS — BP 136/78 | HR 98 | Wt 217.7 lb

## 2015-07-06 DIAGNOSIS — Z3493 Encounter for supervision of normal pregnancy, unspecified, third trimester: Secondary | ICD-10-CM

## 2015-07-06 DIAGNOSIS — O34219 Maternal care for unspecified type scar from previous cesarean delivery: Secondary | ICD-10-CM

## 2015-07-06 LAB — POCT URINALYSIS DIPSTICK
BILIRUBIN UA: NEGATIVE
Glucose, UA: NEGATIVE
Ketones, UA: NEGATIVE
LEUKOCYTES UA: NEGATIVE
NITRITE UA: NEGATIVE
PH UA: 6.5
PROTEIN UA: NEGATIVE
RBC UA: NEGATIVE
Spec Grav, UA: 1.02
Urobilinogen, UA: NEGATIVE

## 2015-07-06 NOTE — Progress Notes (Signed)
ROB: Patient denies complaints. Does note increasing Deberah PeltonBraxton Hicks. Here for pre-op for scheduled repeat C-section with BTL.  Discussed planned procedure, recovery, anticipated hospital stay. Declines exam today. RTC in 1 week.  Labor precautions given.

## 2015-07-14 ENCOUNTER — Encounter
Admission: RE | Admit: 2015-07-14 | Discharge: 2015-07-14 | Disposition: A | Discharge status: Home / Self Care | Payer: BLUE CROSS/BLUE SHIELD | Source: Ambulatory Visit | Attending: Obstetrics and Gynecology | Admitting: Obstetrics and Gynecology

## 2015-07-14 HISTORY — DX: Personal history of other medical treatment: Z92.89

## 2015-07-14 HISTORY — DX: Nausea with vomiting, unspecified: R11.2

## 2015-07-14 HISTORY — DX: Other specified postprocedural states: Z98.890

## 2015-07-14 LAB — CBC
HCT: 30.9 % — ABNORMAL LOW (ref 35.0–47.0)
Hemoglobin: 10.1 g/dL — ABNORMAL LOW (ref 12.0–16.0)
MCH: 29.5 pg (ref 26.0–34.0)
MCHC: 32.9 g/dL (ref 32.0–36.0)
MCV: 89.9 fL (ref 80.0–100.0)
PLATELETS: 121 10*3/uL — AB (ref 150–440)
RBC: 3.43 MIL/uL — ABNORMAL LOW (ref 3.80–5.20)
RDW: 16.6 % — AB (ref 11.5–14.5)
WBC: 9.3 10*3/uL (ref 3.6–11.0)

## 2015-07-14 LAB — TYPE AND SCREEN
ABO/RH(D): A POS
ANTIBODY SCREEN: NEGATIVE
Extend sample reason: UNDETERMINED

## 2015-07-14 LAB — RAPID HIV SCREEN (HIV 1/2 AB+AG)
HIV 1/2 ANTIBODIES: NONREACTIVE
HIV-1 P24 ANTIGEN - HIV24: NONREACTIVE

## 2015-07-14 NOTE — Patient Instructions (Signed)
  Your procedure is scheduled on: Friday 07/15/15 Report to [facility] AT 11:00 AM .  Remember: Instructions that are not followed completely may result in serious medical risk, up to and including death, or upon the discretion of your surgeon and anesthesiologist your surgery may need to be rescheduled.    __X__ 1. Do not eat food or drink liquids after midnight. No gum chewing or hard candies.     __X__ 2. No Alcohol for 24 hours before or after surgery.   ____ 3. Bring all medications with you on the day of surgery if instructed.    __X__ 4. Notify your doctor if there is any change in your medical condition     (cold, fever, infections).     Do not wear jewelry, make-up, hairpins, clips or nail polish.  Do not wear lotions, powders, or perfumes.   Do not shave 48 hours prior to surgery. Men may shave face and neck.  Do not bring valuables to the hospital.    [facility] or [facility]uables.               Contacts, dentures or bridgework may not be worn into surgery.  Leave your suitcase in the car. After surgery it may be brought to your room.  For patients admitted to the hospital, discharge time is determined by your                treatment team.   Patients discharged the day of surgery will not be allowed to drive home.   Please read over the following fact sheets that you were given:   Surgical Site Infection Prevention   ____ Take these medicines the morning of surgery with A SIP OF WATER:    1. NONE  2.   3.   4.  5.  6.  ____ Fleet Enema (as directed)   __X__ Use CHG Soap as directed  ____ Use inhalers on the day of surgery  ____ Stop metformin 2 days prior to surgery    ____ Take 1/2 of usual insulin dose the night before surgery and none on the morning of surgery.   ____ Stop Coumadin/Plavix/aspirin on   ____ Stop Anti-inflammatories on    ____ Stop supplements until after surgery.    ____ Bring C-Pap to the  hospital.

## 2015-07-15 ENCOUNTER — Inpatient Hospital Stay: Payer: BLUE CROSS/BLUE SHIELD | Admitting: Certified Registered Nurse Anesthetist

## 2015-07-15 ENCOUNTER — Encounter
Admission: AD | Disposition: A | Discharge status: Home / Self Care | Payer: Self-pay | Source: Ambulatory Visit | Attending: Obstetrics and Gynecology

## 2015-07-15 ENCOUNTER — Inpatient Hospital Stay
Admission: AD | Admit: 2015-07-15 | Discharge: 2015-07-17 | DRG: 766 | Disposition: A | Discharge status: Home / Self Care | Payer: BLUE CROSS/BLUE SHIELD | Source: Ambulatory Visit | Attending: Obstetrics and Gynecology | Admitting: Obstetrics and Gynecology

## 2015-07-15 ENCOUNTER — Encounter: Payer: BLUE CROSS/BLUE SHIELD | Admitting: Obstetrics and Gynecology

## 2015-07-15 DIAGNOSIS — O34219 Maternal care for unspecified type scar from previous cesarean delivery: Secondary | ICD-10-CM | POA: Diagnosis present

## 2015-07-15 DIAGNOSIS — O34211 Maternal care for low transverse scar from previous cesarean delivery: Secondary | ICD-10-CM | POA: Diagnosis present

## 2015-07-15 DIAGNOSIS — Z8249 Family history of ischemic heart disease and other diseases of the circulatory system: Secondary | ICD-10-CM | POA: Diagnosis not present

## 2015-07-15 DIAGNOSIS — Z302 Encounter for sterilization: Secondary | ICD-10-CM

## 2015-07-15 DIAGNOSIS — Z87891 Personal history of nicotine dependence: Secondary | ICD-10-CM

## 2015-07-15 DIAGNOSIS — Z3A39 39 weeks gestation of pregnancy: Secondary | ICD-10-CM

## 2015-07-15 DIAGNOSIS — Z8632 Personal history of gestational diabetes: Secondary | ICD-10-CM

## 2015-07-15 DIAGNOSIS — R8271 Bacteriuria: Secondary | ICD-10-CM | POA: Diagnosis present

## 2015-07-15 DIAGNOSIS — N736 Female pelvic peritoneal adhesions (postinfective): Secondary | ICD-10-CM | POA: Diagnosis present

## 2015-07-15 DIAGNOSIS — Z833 Family history of diabetes mellitus: Secondary | ICD-10-CM

## 2015-07-15 DIAGNOSIS — Z3490 Encounter for supervision of normal pregnancy, unspecified, unspecified trimester: Secondary | ICD-10-CM

## 2015-07-15 DIAGNOSIS — Z98891 History of uterine scar from previous surgery: Secondary | ICD-10-CM

## 2015-07-15 DIAGNOSIS — D649 Anemia, unspecified: Secondary | ICD-10-CM | POA: Diagnosis present

## 2015-07-15 DIAGNOSIS — R7309 Other abnormal glucose: Secondary | ICD-10-CM

## 2015-07-15 DIAGNOSIS — Z3483 Encounter for supervision of other normal pregnancy, third trimester: Secondary | ICD-10-CM

## 2015-07-15 LAB — ABO/RH: ABO/RH(D): A POS

## 2015-07-15 LAB — RPR: RPR Ser Ql: NONREACTIVE

## 2015-07-15 SURGERY — Surgical Case
Anesthesia: Spinal | Wound class: Clean Contaminated

## 2015-07-15 MED ORDER — LANOLIN HYDROUS EX OINT: 1.0000 "application " | TOPICAL_OINTMENT | CUTANEOUS | Status: DC | PRN

## 2015-07-15 MED ORDER — OXYTOCIN 40 UNITS IN LACTATED RINGERS INFUSION - SIMPLE MED
INTRAVENOUS | Status: DC | PRN
  Administered 2015-07-15: 40 mL via INTRAVENOUS
  Administered 2015-07-15: 20 mL via INTRAVENOUS

## 2015-07-15 MED ORDER — FENTANYL CITRATE (PF) 100 MCG/2ML IJ SOLN
INTRAMUSCULAR | Status: DC | PRN
  Administered 2015-07-15 (×3): 50 ug via INTRAVENOUS

## 2015-07-15 MED ORDER — CEFAZOLIN SODIUM-DEXTROSE 2-3 GM-% IV SOLR
INTRAVENOUS | Status: AC
  Filled 2015-07-15: qty 50

## 2015-07-15 MED ORDER — OXYCODONE-ACETAMINOPHEN 5-325 MG PO TABS
2.0000 | ORAL_TABLET | ORAL | Status: DC | PRN
Start: 2015-07-15 — End: 2015-07-17

## 2015-07-15 MED ORDER — CEFAZOLIN SODIUM-DEXTROSE 2-4 GM/100ML-% IV SOLN
2.0000 g | INTRAVENOUS | Status: AC
  Administered 2015-07-15: 2 g via INTRAVENOUS
  Filled 2015-07-15 (×2): qty 100

## 2015-07-15 MED ORDER — LACTATED RINGERS IV SOLN: INTRAVENOUS | Status: DC

## 2015-07-15 MED ORDER — OXYTOCIN 10 UNIT/ML IJ SOLN
2.5000 [IU]/h | INTRAVENOUS | Status: AC
  Administered 2015-07-16: 2.5 [IU]/h via INTRAVENOUS
  Filled 2015-07-15: qty 10

## 2015-07-15 MED ORDER — DEXAMETHASONE SODIUM PHOSPHATE 10 MG/ML IJ SOLN
INTRAMUSCULAR | Status: DC | PRN
  Administered 2015-07-15: 10 mg via INTRAVENOUS

## 2015-07-15 MED ORDER — ONDANSETRON HCL 4 MG/2ML IJ SOLN: 4.0000 mg | Freq: Once | INTRAMUSCULAR | Status: DC | PRN

## 2015-07-15 MED ORDER — SIMETHICONE 80 MG PO CHEW
80.0000 mg | CHEWABLE_TABLET | ORAL | Status: DC
  Administered 2015-07-16: 80 mg via ORAL
  Filled 2015-07-15: qty 1

## 2015-07-15 MED ORDER — OXYCODONE-ACETAMINOPHEN 5-325 MG PO TABS
1.0000 | ORAL_TABLET | ORAL | Status: DC | PRN
Start: 2015-07-15 — End: 2015-07-17

## 2015-07-15 MED ORDER — CITRIC ACID-SODIUM CITRATE 334-500 MG/5ML PO SOLN
ORAL | Status: AC
  Administered 2015-07-15: 30 mL
  Filled 2015-07-15: qty 15

## 2015-07-15 MED ORDER — PRENATAL MULTIVITAMIN CH
1.0000 | ORAL_TABLET | Freq: Every day | ORAL | Status: DC
  Administered 2015-07-16 – 2015-07-17 (×2): 1 via ORAL
  Filled 2015-07-15 (×2): qty 1

## 2015-07-15 MED ORDER — TETANUS-DIPHTH-ACELL PERTUSSIS 5-2.5-18.5 LF-MCG/0.5 IM SUSP: 0.5000 mL | Freq: Once | INTRAMUSCULAR | Status: DC

## 2015-07-15 MED ORDER — LACTATED RINGERS IV SOLN
INTRAVENOUS | Status: DC
  Administered 2015-07-15 (×2): via INTRAVENOUS

## 2015-07-15 MED ORDER — IBUPROFEN 600 MG PO TABS
600.0000 mg | ORAL_TABLET | Freq: Four times a day (QID) | ORAL | Status: DC
  Administered 2015-07-15 – 2015-07-17 (×8): 600 mg via ORAL
  Filled 2015-07-15 (×5): qty 1

## 2015-07-15 MED ORDER — WITCH HAZEL-GLYCERIN EX PADS: 1.0000 "application " | MEDICATED_PAD | CUTANEOUS | Status: DC | PRN

## 2015-07-15 MED ORDER — LIDOCAINE 5 % EX PTCH
MEDICATED_PATCH | CUTANEOUS | Status: AC
  Filled 2015-07-15: qty 1

## 2015-07-15 MED ORDER — MEASLES, MUMPS & RUBELLA VAC ~~LOC~~ INJ
0.5000 mL | INJECTION | Freq: Once | SUBCUTANEOUS | Status: DC
  Filled 2015-07-15: qty 0.5

## 2015-07-15 MED ORDER — OXYTOCIN 40 UNITS IN LACTATED RINGERS INFUSION - SIMPLE MED
INTRAVENOUS | Status: AC
  Filled 2015-07-15: qty 1000

## 2015-07-15 MED ORDER — FENTANYL CITRATE (PF) 100 MCG/2ML IJ SOLN
25.0000 ug | INTRAMUSCULAR | Status: DC | PRN
  Administered 2015-07-15: 25 ug via INTRAVENOUS
  Filled 2015-07-15: qty 2

## 2015-07-15 MED ORDER — DIPHENHYDRAMINE HCL 25 MG PO CAPS: 25.0000 mg | ORAL_CAPSULE | Freq: Four times a day (QID) | ORAL | Status: DC | PRN

## 2015-07-15 MED ORDER — DIBUCAINE 1 % RE OINT: 1.0000 "application " | TOPICAL_OINTMENT | RECTAL | Status: DC | PRN

## 2015-07-15 MED ORDER — SENNOSIDES-DOCUSATE SODIUM 8.6-50 MG PO TABS
2.0000 | ORAL_TABLET | ORAL | Status: DC
  Administered 2015-07-16: 2 via ORAL
  Filled 2015-07-15: qty 2

## 2015-07-15 MED ORDER — ONDANSETRON HCL 4 MG/2ML IJ SOLN
INTRAMUSCULAR | Status: DC | PRN
  Administered 2015-07-15: 4 mg via INTRAVENOUS

## 2015-07-15 MED ORDER — ZOLPIDEM TARTRATE 5 MG PO TABS: 5.0000 mg | ORAL_TABLET | Freq: Every evening | ORAL | Status: DC | PRN

## 2015-07-15 MED ORDER — MENTHOL 3 MG MT LOZG: 1.0000 | LOZENGE | OROMUCOSAL | Status: DC | PRN

## 2015-07-15 MED ORDER — BUPIVACAINE IN DEXTROSE 0.75-8.25 % IT SOLN
INTRATHECAL | Status: DC | PRN
  Administered 2015-07-15: 1.8 mL via INTRATHECAL

## 2015-07-15 MED ORDER — ACETAMINOPHEN 325 MG PO TABS
650.0000 mg | ORAL_TABLET | ORAL | Status: DC | PRN
Start: 2015-07-15 — End: 2015-07-17
  Administered 2015-07-16 (×2): 650 mg via ORAL
  Filled 2015-07-15 (×2): qty 2

## 2015-07-15 MED ORDER — FERROUS SULFATE 325 (65 FE) MG PO TABS
325.0000 mg | ORAL_TABLET | Freq: Two times a day (BID) | ORAL | Status: DC
  Administered 2015-07-16 – 2015-07-17 (×3): 325 mg via ORAL
  Filled 2015-07-15 (×3): qty 1

## 2015-07-15 MED ORDER — MAGNESIUM HYDROXIDE 400 MG/5ML PO SUSP: 30.0000 mL | ORAL | Status: DC | PRN

## 2015-07-15 MED ORDER — SIMETHICONE 80 MG PO CHEW
80.0000 mg | CHEWABLE_TABLET | ORAL | Status: DC | PRN
  Administered 2015-07-17: 80 mg via ORAL
  Filled 2015-07-15: qty 1

## 2015-07-15 MED ORDER — MEPERIDINE HCL 25 MG/ML IJ SOLN: 6.2500 mg | INTRAMUSCULAR | Status: DC | PRN

## 2015-07-15 MED ORDER — PHENYLEPHRINE HCL 10 MG/ML IJ SOLN
INTRAMUSCULAR | Status: DC | PRN
  Administered 2015-07-15 (×4): 200 ug via INTRAVENOUS

## 2015-07-15 MED ORDER — MORPHINE SULFATE (PF) 0.5 MG/ML IJ SOLN
INTRAMUSCULAR | Status: DC | PRN
  Administered 2015-07-15: .2 mg via EPIDURAL

## 2015-07-15 SURGICAL SUPPLY — 28 items
BAG COUNTER SPONGE EZ (MISCELLANEOUS) ×2 IMPLANT
CANISTER SUCT 3000ML (MISCELLANEOUS) ×3 IMPLANT
CHLORAPREP W/TINT 26ML (MISCELLANEOUS) ×6 IMPLANT
CLOSURE WOUND 1/4X4 (GAUZE/BANDAGES/DRESSINGS) ×1
COUNTER SPONGE BAG EZ (MISCELLANEOUS) ×1
DRSG TELFA 3X8 NADH (GAUZE/BANDAGES/DRESSINGS) ×3 IMPLANT
ELECT REM PT RETURN 9FT ADLT (ELECTROSURGICAL) ×3
ELECTRODE REM PT RTRN 9FT ADLT (ELECTROSURGICAL) ×1 IMPLANT
GAUZE SPONGE 4X4 12PLY STRL (GAUZE/BANDAGES/DRESSINGS) ×3 IMPLANT
GLOVE BIO SURGEON STRL SZ 6 (GLOVE) ×3 IMPLANT
GLOVE BIOGEL PI IND STRL 6.5 (GLOVE) ×1 IMPLANT
GLOVE BIOGEL PI INDICATOR 6.5 (GLOVE) ×2
GOWN STRL REUS W/ TWL LRG LVL3 (GOWN DISPOSABLE) ×2 IMPLANT
GOWN STRL REUS W/TWL LRG LVL3 (GOWN DISPOSABLE) ×4
KIT RM TURNOVER STRD PROC AR (KITS) ×3 IMPLANT
MARKER SKIN DUAL TIP RULER LAB (MISCELLANEOUS) ×3 IMPLANT
NS IRRIG 1000ML POUR BTL (IV SOLUTION) ×3 IMPLANT
PACK C SECTION AR (MISCELLANEOUS) ×3 IMPLANT
PAD OB MATERNITY 4.3X12.25 (PERSONAL CARE ITEMS) ×3 IMPLANT
PAD PREP 24X41 OB/GYN DISP (PERSONAL CARE ITEMS) ×3 IMPLANT
STRIP CLOSURE SKIN 1/4X4 (GAUZE/BANDAGES/DRESSINGS) ×2 IMPLANT
SUT CHROMIC 0 CT 1 (SUTURE) IMPLANT
SUT MNCRL AB 4-0 PS2 18 (SUTURE) ×3 IMPLANT
SUT PLAIN 2 0 XLH (SUTURE) IMPLANT
SUT VIC AB 0 CT1 36 (SUTURE) ×6 IMPLANT
SUT VIC AB 1 CT1 36 (SUTURE) ×6 IMPLANT
SUT VIC AB 3-0 SH 27 (SUTURE) ×6
SUT VIC AB 3-0 SH 27X BRD (SUTURE) ×3 IMPLANT

## 2015-07-15 NOTE — Transfer of Care (Signed)
Immediate Anesthesia Transfer of Care Note  Patient: Sharon Warren  Procedure(s) Performed: Procedure(s): REPEAT CESAREAN SECTION (N/A)  Patient Location: PACU  Anesthesia Type:Spinal  Level of Consciousness: awake, alert  and oriented  Airway & Oxygen Therapy: Patient Spontanous Breathing  Post-op Assessment: Report given to RN and Post -op Vital signs reviewed and stable  Post vital signs: Reviewed and stable  Last Vitals:  Filed Vitals:   07/15/15 1201 07/15/15 1520  BP: 137/73 140/67  Pulse: 94 80  Temp: 37.1 C 36.5 C  Resp: 16 14    Complications: No apparent anesthesia complications

## 2015-07-15 NOTE — Anesthesia Procedure Notes (Signed)
Spinal  Start time: 07/15/2015 1:30 PM End time: 07/15/2015 1:40 PM Reason for block: at surgeon's request Staffing Anesthesiologist: Elijio MilesVAN STAVEREN, Bethzy Hauck F Performed by: anesthesiologist  Preanesthetic Checklist Completed: patient identified, site marked, surgical consent, pre-op evaluation, timeout performed, IV checked, risks and benefits discussed, monitors and equipment checked and at surgeon's request Spinal Block Patient position: sitting Prep: Betadine Patient monitoring: heart rate and blood pressure Approach: midline Location: L3-4 Injection technique: single-shot Needle Needle type: Tuohy  Needle gauge: 25 G Needle length: 9 cm Needle insertion depth: 7 cm Assessment Sensory level: T6 Events: high spinal

## 2015-07-15 NOTE — Anesthesia Preprocedure Evaluation (Signed)
Anesthesia Evaluation  Patient identified by MRN, date of birth, ID band Patient awake    Reviewed: Allergy & Precautions, NPO status , Patient's Chart, lab work & pertinent test results  History of Anesthesia Complications (+) PONV  Airway Mallampati: II       Dental  (+) Teeth Intact   Pulmonary asthma , former smoker,    Pulmonary exam normal        Cardiovascular Exercise Tolerance: Good  Rhythm:Regular Rate:Normal     Neuro/Psych Anxiety    GI/Hepatic negative GI ROS, Neg liver ROS,   Endo/Other  diabetes  Renal/GU negative Renal ROS     Musculoskeletal   Abdominal Normal abdominal exam  (+)   Peds  Hematology  (+) anemia ,   Anesthesia Other Findings   Reproductive/Obstetrics                             Anesthesia Physical Anesthesia Plan  ASA: II  Anesthesia Plan: Spinal   Post-op Pain Management:    Induction:   Airway Management Planned: Natural Airway and Nasal Cannula  Additional Equipment:   Intra-op Plan:   Post-operative Plan:   Informed Consent: I have reviewed the patients History and Physical, chart, labs and discussed the procedure including the risks, benefits and alternatives for the proposed anesthesia with the patient or authorized representative who has indicated his/her understanding and acceptance.     Plan Discussed with: CRNA  Anesthesia Plan Comments:         Anesthesia Quick Evaluation

## 2015-07-15 NOTE — H&P (Signed)
Obstetric Preoperative History and Physical  Sharon Warren is a 30 y.o. G2P0101 with IUP at 1674w1d presenting for presenting for scheduled repeat cesarean section with bilateral tubal ligation.  No acute concerns.   Prenatal Course Source of Care: [facility] with onset of care at 11 weeks Pregnancy complications or risks: Patient Active Problem List   Diagnosis Date Noted  . H/O cesarean section complicating pregnancy 05/25/2015  . Supervision of normal pregnancy 05/25/2015  . Anemia 04/30/2015  . Elevated glucose tolerance test 04/30/2015  . GBS bacteriuria 03/08/2015  . Eczema 01/26/2015   She plans to breastfeed She desires bilateral tubal ligation for postpartum contraception.   Prenatal labs and studies: ABO, Rh: --/--/A POS (03/23 0915) Antibody: NEG (03/23 0914) Rubella: 1.45 (08/17 1158) RPR: Non Reactive (03/23 0914)  HBsAg: Negative (08/17 1158)  HIV: Non Reactive (08/17 1158)  GBS: Positive (urine culture) 1 hr Glucola elevated (167), with normal 3 hr Genetic screening normal Anatomy US normal   Past Medical History  Diagnosis Date  . Asthma   . GDM (gestational diabetes mellitus) 2010  . History of anemia   . Anxiety   . Transfusion history   . PONV (postoperative nausea and vomiting)     nausea from pain medicine    Past Surgical History  Procedure Laterality Date  . Cesarean section  2010  . Dental implants      OB History  Gravida Para Term Preterm AB SAB TAB Ectopic Multiple Living  2 1  1      1     # Outcome Date GA Lbr Len/2nd Weight Sex Delivery Anes PTL Lv  2 Current           1 Preterm 10/18/08 5989w0d  7 lb 4 oz (3.289 kg)  CS-LTranv   Y    Obstetric Comments  Previous infant with 'leaky bowels' in-utero and scheduled c/s delivery at 34 weeks- normal now    Social History   Social History  . Marital Status: Married    Spouse Name: N/A  . Number of Children: N/A  . Years of Education: N/A   Social History Main Topics   . Smoking status: Former Smoker    Quit date: 10/11/2014  . Smokeless tobacco: Never Used  . Alcohol Use: 0.0 oz/week    0 Standard drinks or equivalent per week  . Drug Use: No  . Sexual Activity: Yes    Birth Control/ Protection: None   Other Topics Concern  . Not on file   Social History Narrative    Family History  Problem Relation Age of Onset  . Heart disease Paternal Grandfather   . Diabetes Paternal Grandfather   . Hypertension Maternal Grandfather   . Diabetes Father     No prescriptions prior to admission    No Known Allergies  Review of Systems: Negative except for what is mentioned in HPI.  Physical Exam:  07/14/15 217 lb 11 oz (98.742 kg)   07/14/15 120/66   07/14/15 102   LMP 10/14/2014 (Exact Date)  FHR by Doppler: 140 bpm GENERAL: Well-developed, well-nourished female in no acute distress.  LUNGS: Clear to auscultation bilaterally.  HEART: Regular rate and rhythm. ABDOMEN: Soft, nontender, nondistended, gravid, well-healed Pfannenstiel incision. PELVIC: Deferred EXTREMITIES: Nontender, no edema, 2+ distal pulses.   Pertinent Labs/Studies:   Results for orders placed or performed during the hospital encounter of 07/14/15 (from the past 72 hour(s))  CBC     Status: Abnormal   Collection  Time: 07/14/15  9:14 AM  Result Value Ref Range   WBC 9.3 3.6 - 11.0 K/uL   RBC 3.43 (L) 3.80 - 5.20 MIL/uL   Hemoglobin 10.1 (L) 12.0 - 16.0 g/dL   HCT 40.9 (L) 81.1 - 91.4 %   MCV 89.9 80.0 - 100.0 fL   MCH 29.5 26.0 - 34.0 pg   MCHC 32.9 32.0 - 36.0 g/dL   RDW 78.2 (H) 95.6 - 21.3 %   Platelets 121 (L) 150 - 440 K/uL  Rapid HIV screen (HIV 1/2 Ab+Ag)     Status: None   Collection Time: 07/14/15  9:14 AM  Result Value Ref Range   HIV-1 P24 Antigen - HIV24 NON REACTIVE NON REACTIVE   HIV 1/2 Antibodies NON REACTIVE NON REACTIVE   Interpretation (HIV Ag Ab)      A non reactive test result means that HIV 1 or HIV 2 antibodies and HIV 1 p24 antigen were  not detected in the specimen.  RPR     Status: None   Collection Time: 07/14/15  9:14 AM  Result Value Ref Range   RPR Ser Ql Non Reactive Non Reactive    Comment: (NOTE) Performed At: [facility] [address] [place], [place] [id number] Mila Homer MD GE:[id number]   Type and screen     Status: None   Collection Time: 07/14/15  9:14 AM  Result Value Ref Range   ABO/RH(D) A POS    Antibody Screen NEG    Sample Expiration 07/17/2015    Extend sample reason PREGNANT WITHIN 3 MONTHS, UNABLE TO EXTEND   ABO/Rh     Status: None   Collection Time: 07/14/15  9:15 AM  Result Value Ref Range   ABO/RH(D) A POS     Assessment and Plan :Sharon Warren is a 30 y.o. G2P0101 at [redacted]w[redacted]d being admitted  for scheduled cesarean section delivery with bilateral tubal ligation. The patient is understanding of the planned procedure and is aware of and accepting of all surgical risks, including but not limited to: bleeding which may require transfusion or reoperation; infection which may require antibiotics; injury to bowel, bladder, ureters or other surrounding organs which may require repair; injury to the fetus; need for additional procedures including hysterectomy in the event of life-threatening complications; placental abnormalities wth subsequent pregnancies; incisional problems; blood clot disorders which may require blood thinners; and other postoperative/anesthesia complications. Patient also desires permanent sterilization.  Other reversible forms of contraception were discussed with patient; she declines all other modalities. Risks of procedure discussed with patient including but not limited to: risk of regret, permanence of method, bleeding, infection, injury to surrounding organs and need for additional procedures.  Failure risk of 1-2 % with increased risk of ectopic gestation if pregnancy occurs was also discussed with patient.  Patient verbalized understanding of these risks and wants  to proceed with sterilization. The patient is in agreement with the proposed plan, and gives informed written consent for the procedure. All questions have been answered.  Hildred Laser, MD [facility]

## 2015-07-15 NOTE — Op Note (Addendum)
Cesarean Section Procedure Note  Indications: previous uterine incision low transverse  Pre-operative Diagnosis: 39 week 1 day pregnancy.  Post-operative Diagnosis: same  Surgeon: Hildred Laser, MD  Assistants: Yolanda Bonine, CNM  Procedure: Repeat low transverse Cesarean Section  Anesthesia: Spinal anesthesia  ASA Class: II  Procedure Details: The patient was seen in the Holding Room. The risks, benefits, complications, treatment options, and expected outcomes were discussed with the patient.  The patient concurred with the proposed plan, giving informed consent.  The site of surgery properly noted/marked. The patient was taken to the Operating Room, identified as Sharon Warren and the procedure verified as C-Section Delivery. A Time Out was held and the above information confirmed.  After induction of anesthesia, the patient was draped and prepped in the usual sterile manner. Anesthesia was tested and noted to be adequate. A Pfannenstiel incision was made and carried down through the subcutaneous tissue to the fascia. Fascial incision was made and extended transversely. The fascia was separated from the underlying rectus tissue superiorly and inferiorly. The peritoneum was identified and entered. Peritoneal incision was extended longitudinally. The utero-vesical peritoneal reflection was incised transversely and the bladder flap was bluntly freed from the lower uterine segment. A low transverse uterine incision was made. Delivered from cephalic presentation was a 3990 gram Female with Apgar scores of 8 at one minute and 9 at five minutes.After the umbilical cord was clamped and cut cord blood was collected for blood bank donation.  The placenta was removed intact and appeared normal. The uterus was exteriorized and cleared of all clots and debris. The uterine outline, tubes and ovaries appeared normal, except adhesions of fallopian tubes to ovaries were noted bilaterally.  The uterine incision  was closed with a running locked suture of 0-Vicryl.   Hemostasis was observed.   Attention was then turned to the fallopian tubes, and where the patient's right fallopian tube was identified and grasped with a Babcock clamp.  The tube was then followed out to the vimbria.  The Babcock clamp was then used to grasp the tube approximately 4 cm from the cornual region.  A 3 cm segment of tube was then ligated with a free tie of 0-Chromic using the [place] method and excised.  The left fallopian tube was then ligated in a similar fashion and excised. The tubal lumens were cauterized bilaterally.  Good hemostasis was noted with bilateral fallopian tubes. The uterus was then returned to the abdomen.   Lavage was carried out until clear. The peritoneum was reapproximated with 3-0 Vicryl.  The fascia was then reapproximated with a running suture of 0 Vicryl. The subcutaneous fat layer was reapproximated with 3-0 Vicryl. The skin was reapproximated with 4-0 Monocryl.  Steri-strips were applied to the incision.  A lidoderm patch was placed above and below the incision site.   Instrument, sponge, and needle counts were correct prior the abdominal closure and at the conclusion of the case.   Findings: Female infant, cephalic presentation, 3990 grams, with Apgar scores of 8 at one minute and 9 at five minutes. Intact placenta with 3 vessel cord.  The uterine outline, tubes and ovaries appeared normal, with exception of adhesions of fallopian tubes to ovaries bilaterally.  Estimated Blood Loss:  450 ml      Drains: foley catheter to gravity drainage, 150 clear urine at end of the procedure  Total IV Fluids:  1000 ml  Specimens: Placenta and Disposition:  Sent to Pathology  Implants: None         Complications:  None; patient tolerated the procedure well.         Disposition: PACU - hemodynamically stable.         Condition: stable    Hildred LaserAnika Fanchon Papania, MD [facility]

## 2015-07-16 LAB — CBC
HEMATOCRIT: 26.8 % — AB (ref 35.0–47.0)
HEMOGLOBIN: 9 g/dL — AB (ref 12.0–16.0)
MCH: 30.3 pg (ref 26.0–34.0)
MCHC: 33.7 g/dL (ref 32.0–36.0)
MCV: 89.9 fL (ref 80.0–100.0)
Platelets: 107 10*3/uL — ABNORMAL LOW (ref 150–440)
RBC: 2.98 MIL/uL — ABNORMAL LOW (ref 3.80–5.20)
RDW: 17.1 % — AB (ref 11.5–14.5)
WBC: 13.2 10*3/uL — ABNORMAL HIGH (ref 3.6–11.0)

## 2015-07-16 MED ORDER — NALBUPHINE HCL 10 MG/ML IJ SOLN
5.0000 mg | INTRAMUSCULAR | Status: DC | PRN
Start: 2015-07-16 — End: 2015-07-17

## 2015-07-16 MED ORDER — IBUPROFEN 600 MG PO TABS
600.0000 mg | ORAL_TABLET | Freq: Four times a day (QID) | ORAL | Status: DC | PRN
  Filled 2015-07-16 (×3): qty 1

## 2015-07-16 MED ORDER — NALOXONE HCL 2 MG/2ML IJ SOSY: 1.0000 ug/kg/h | PREFILLED_SYRINGE | INTRAVENOUS | Status: DC | PRN

## 2015-07-16 MED ORDER — SCOPOLAMINE 1 MG/3DAYS TD PT72: 1.0000 | MEDICATED_PATCH | Freq: Once | TRANSDERMAL | Status: DC

## 2015-07-16 MED ORDER — DIPHENHYDRAMINE HCL 25 MG PO CAPS: 25.0000 mg | ORAL_CAPSULE | ORAL | Status: DC | PRN

## 2015-07-16 MED ORDER — NALBUPHINE HCL 10 MG/ML IJ SOLN: 5.0000 mg | INTRAMUSCULAR | Status: DC | PRN

## 2015-07-16 MED ORDER — DIPHENHYDRAMINE HCL 50 MG/ML IJ SOLN: 12.5000 mg | INTRAMUSCULAR | Status: DC | PRN

## 2015-07-16 MED ORDER — SODIUM CHLORIDE 0.9% FLUSH: 3.0000 mL | INTRAVENOUS | Status: DC | PRN

## 2015-07-16 MED ORDER — ONDANSETRON HCL 4 MG/2ML IJ SOLN: 4.0000 mg | Freq: Three times a day (TID) | INTRAMUSCULAR | Status: DC | PRN

## 2015-07-16 MED ORDER — NALBUPHINE HCL 10 MG/ML IJ SOLN: 5.0000 mg | Freq: Once | INTRAMUSCULAR | Status: DC | PRN

## 2015-07-16 MED ORDER — NALOXONE HCL 0.4 MG/ML IJ SOLN: 0.4000 mg | INTRAMUSCULAR | Status: DC | PRN

## 2015-07-16 NOTE — Anesthesia Postprocedure Evaluation (Signed)
Anesthesia Post Note  Patient: Sharon Warren  Procedure(s) Performed: Procedure(s) (LRB): REPEAT CESAREAN SECTION (N/A)  Patient location during evaluation: [facility] Anesthesia Type: Spinal Level of consciousness: awake, awake and alert and oriented Pain management: pain level controlled Vital Signs Assessment: post-procedure vital signs reviewed and stable Respiratory status: spontaneous breathing, nonlabored ventilation and respiratory function stable Cardiovascular status: stable Postop Assessment: no headache and no backache Anesthetic complications: no    Last Vitals:  Filed Vitals:   07/16/15 0437 07/16/15 0757  BP: 121/62 113/65  Pulse: 76 82  Temp: 36.9 C 37.1 C  Resp: 18 18    Last Pain:  Filed Vitals:   07/16/15 1002  PainSc: 0-No pain                 Danie Diehl,  Margit BandaJohn M

## 2015-07-16 NOTE — Progress Notes (Signed)
Subjective: Post Op Day 1 Day Post-Op: Cesarean Delivery Patient reports tolerating PO, + flatus and Breastfeeding.    Objective: Vital signs in last 24 hours: Temp:  [97.7 F (36.5 C)-98.7 F (37.1 C)] 98.7 F (37.1 C) (03/25 0757) Pulse Rate:  [76-94] 82 (03/25 0757) Resp:  [14-18] 18 (03/25 0757) BP: (113-140)/(54-75) 113/65 mmHg (03/25 0757) SpO2:  [97 %-100 %] 100 % (03/25 0757) Weight:  [98.431 kg (217 lb)] 98.431 kg (217 lb) (03/24 1201)  Physical Exam:  General: alert, cooperative, appears stated age and pale Lochia: appropriate Uterine Fundus: firm Incision: healing well, no significant drainage DVT Evaluation: No evidence of DVT seen on physical exam. Negative Homan's sign.   Recent Labs  07/14/15 0914 07/16/15 0646  HGB 10.1* 9.0*  HCT 30.9* 26.8*     Assessment/Plan: Status post Cesarean section. Doing well postoperatively.  Continue current care. Dressing removed, will d/c foley, and then IV when voidings, encouraged moving around today. OK to shower.  Jaxtyn Linville N Nicoya Friel,CNM 07/16/2015, 11:25 AM

## 2015-07-16 NOTE — Progress Notes (Signed)
Postpartum Day # 1: Cesarean Delivery  Subjective: Patient reports tolerating PO and no problems voiding.    Objective: Vital signs in last 24 hours: Temp:  [97.7 F (36.5 C)-98.7 F (37.1 C)] 98.7 F (37.1 C) (03/25 0757) Pulse Rate:  [76-94] 82 (03/25 0757) Resp:  [14-18] 18 (03/25 0757) BP: (113-140)/(54-75) 113/65 mmHg (03/25 0757) SpO2:  [97 %-100 %] 100 % (03/25 0757) Weight:  [217 lb (98.431 kg)] 217 lb (98.431 kg) (03/24 1201)  Physical Exam:  General: alert and no distress Lungs: clear to auscultation bilaterally Breasts: normal appearance, no masses or tenderness Heart: regular rate and rhythm, S1, S2 normal, no murmur, click, rub or gallop Pelvis: Lochia appropriate, Uterine Fundus firm, Incision: healing well, no significant drainage, no dehiscence, no significant erythema Extremities: DVT Evaluation: Negative Homan's sign. No cords or calf tenderness. No significant calf/ankle edema.   Recent Labs  07/14/15 0914 07/16/15 0646  HGB 10.1* 9.0*  HCT 30.9* 26.8*    Assessment/Plan: Status post Cesarean section. Doing well postoperatively.  Breastfeeding and Contraception BTL Advance diet Continue PO pain management Remove foley catheter Discontinue IVF Continue current care. Plan for discharge   Hildred Lasernika Branson Kranz [facility]

## 2015-07-16 NOTE — Anesthesia Postprocedure Evaluation (Signed)
  Anesthesia Post-op Note  Patient: Sharon Warren  Procedure(s) Performed: Procedure(s): REPEAT CESAREAN SECTION (N/A)  Anesthesia type:Spinal  Patient location: PACU  Post pain: Pain level controlled  Post assessment: Post-op Vital signs reviewed, Patient's Cardiovascular Status Stable, Respiratory Function Stable, Patent Airway and No signs of Nausea or vomiting  Post vital signs: Reviewed and stable  Last Vitals:  Filed Vitals:   07/16/15 0437 07/16/15 0757  BP: 121/62 113/65  Pulse: 76 82  Temp: 36.9 C 37.1 C  Resp: 18 18    Level of consciousness: awake, alert  and patient cooperative  Complications: No apparent anesthesia complications

## 2015-07-17 MED ORDER — VITAMIN D3 125 MCG (5000 UT) PO CAPS: 1.0000 | ORAL_CAPSULE | Freq: Every day | ORAL | Status: DC

## 2015-07-17 MED ORDER — IBUPROFEN 600 MG PO TABS: 600.0000 mg | ORAL_TABLET | Freq: Four times a day (QID) | ORAL | Status: DC | PRN

## 2015-07-17 MED ORDER — OXYCODONE-ACETAMINOPHEN 5-325 MG PO TABS: 2.0000 | ORAL_TABLET | ORAL | Status: DC | PRN

## 2015-07-17 NOTE — Discharge Instructions (Signed)
Incision Care: Keep incision area clean, dry and open to air. Shower daily to prevent infection. Only pat incisions, no rubbing or circling the incision area. Use mild soap and warm water to clean incisions. Make sure to dry area completely. ° °Monitor incision area for redness, severe pain, drainage, or odor, if so contact your physician. ° °No heavy lifting until cleared by physician. ° °Take pain medications to manage pain, if pain is increased or unrelieved by medications contact your physician.  ° °Make sure to stay active, ambulate often. ° °Call your physician if you develop a temperature greater than 100.4, experience any chest pain, shortness of breath. ° °Make sure to follow up with physician within specified time.  °Bleeding: Your bleeding could continue up to 6 weeks, the flow should gradually decrease and the color should become dark then lightened over the next couple of weeks. If you notice you are bleeding heavily or passing clots larger than the size of your fist, PLEASE call your physician. No TAMPONS, DOUCHING, ENEMAS OR SEXUAL INTERCOURSE for 6 weeks.  ° °Stitches: Shower daily with mild soap and water. Stitches will dissolve over the next couple of weeks, if you experience any discomfort in the vaginal area you may sit in warm water 15-20 minutes, 3-4 times per day. Just enough water to cover vaginal area.  ° °AfterPains: This is the uterus contracting back to its normal position and size. Use medications prescribed or recommended by your physician to help relieve this discomfort.  ° °Bowels/Hemorrhoids: Drink plenty of water and stay active. Increase fiber, fresh fruits and vegetables in your diet.  ° °Rest/Activity: Rest when the baby is resting ° °Bathing: Shower daily! ° °Diet: Continue to eat extra calories until your follow up visit to help replenish nutrients and vitamins. If breastfeeding eat an extra 500-1000 and increase your fluid intake to 12 glasses a day.  ° °Contraception: Consult  with your physician on what method of birth control you would like to use.  ° °Postpartum "BLUES": It is common to emotional days after delivery, however if it persist for greater than 2 weeks or if you feel concerned please let your physician know immediately. This is hormone driven and nothing you can control so please let someone know how you feel. ° °Follow Up Visit: Please schedule a follow up visit with your physician.  °

## 2015-07-17 NOTE — Progress Notes (Signed)
Patient understands all discharge instructions and the need to make follow up appointments. Patient discharge via wheelchair with auxillary. 

## 2015-07-17 NOTE — Discharge Summary (Signed)
Obstetric Discharge Summary Reason for Admission: cesarean section Prenatal Procedures: ultrasound Intrapartum Procedures: cesarean: low cervical, transverse and tubal ligation Postpartum Procedures: none Complications-Operative and Postpartum: none HEMOGLOBIN  Date Value Ref Range Status  07/16/2015 9.0* 12.0 - 16.0 g/dL Final   HCT  Date Value Ref Range Status  07/16/2015 26.8* 35.0 - 47.0 % Final   HEMATOCRIT  Date Value Ref Range Status  05/04/2015 29.1* 34.0 - 46.6 % Final    Physical Exam:  General: alert, cooperative, appears stated age, moderately obese and pale Lochia: appropriate Uterine Fundus: firm Incision: healing well, no significant drainage DVT Evaluation: No evidence of DVT seen on physical exam.  Discharge Diagnoses: Term Pregnancy-delivered and Repeat LTCS with BTL  Discharge Information: Date: 07/17/2015 Activity: pelvic rest Diet: routine Medications: PNV, Tylenol #3, Ibuprofen, Colace, Iron and vitamin d3 Condition: stable Instructions: refer to practice specific booklet Discharge to: home   Newborn Data: Live born female "Lily" Birth Weight: 8 lb 12.7 oz (3990 g) APGAR: 8, 9  Home with mother.  Robbi Scurlock N [place] Torok,CNM 07/17/2015, 12:43 PM

## 2015-07-18 ENCOUNTER — Other Ambulatory Visit: Payer: Self-pay | Admitting: *Deleted

## 2015-07-18 MED ORDER — IBUPROFEN 600 MG PO TABS: 600.0000 mg | ORAL_TABLET | Freq: Four times a day (QID) | ORAL | Status: AC | PRN

## 2015-07-18 MED ORDER — VITAMIN D3 125 MCG (5000 UT) PO CAPS: 1.0000 | ORAL_CAPSULE | Freq: Every day | ORAL | Status: DC

## 2015-07-18 MED ORDER — FUSION PLUS PO CAPS: 1.0000 | ORAL_CAPSULE | Freq: Every day | ORAL | Status: DC

## 2015-07-19 LAB — SURGICAL PATHOLOGY

## 2015-07-21 ENCOUNTER — Encounter: Payer: Self-pay | Admitting: Obstetrics and Gynecology

## 2015-07-21 ENCOUNTER — Encounter: Payer: BLUE CROSS/BLUE SHIELD | Admitting: Obstetrics and Gynecology

## 2015-07-21 ENCOUNTER — Ambulatory Visit (INDEPENDENT_AMBULATORY_CARE_PROVIDER_SITE_OTHER): Payer: BLUE CROSS/BLUE SHIELD | Admitting: Obstetrics and Gynecology

## 2015-07-21 VITALS — BP 118/68 | HR 91 | Wt 200.6 lb

## 2015-07-21 DIAGNOSIS — Z98891 History of uterine scar from previous surgery: Secondary | ICD-10-CM

## 2015-07-21 NOTE — Progress Notes (Signed)
  Subjective:     Sharon Warren is a 30 y.o. female who presents to the clinic 1 weeks status post repeat LTCS with BTL for delivery of infant. Eating a regular diet without difficulty. Bowel movements are normal. Pain is controlled with current analgesics. Medications being used: acetaminophen and ibuprofen (OTC).  The following portions of the patient's history were reviewed and updated as appropriate: allergies, current medications, past family history, past medical history, past social history, past surgical history and problem list.  Review of Systems A comprehensive review of systems was negative.    Objective:    BP 118/68 mmHg  Pulse 91  Wt 200 lb 9.6 oz (90.992 kg)  Breastfeeding? Yes General:  alert, cooperative, appears stated age and mildly obese  Abdomen: soft, bowel sounds active, non-tender  Incision:   healing well, no drainage, no erythema, no hernia, no seroma, no swelling, no dehiscence, incision well approximated     Assessment:    Doing well postoperatively. Operative findings again reviewed. Pathology report discussed.    Plan:    1. Continue any current medications. 2. Wound care discussed. 3. Activity restrictions: no lifting 4. Anticipated return to work: 4 weeks. 5. Follow up: 5 weeks for PPVl.

## 2015-07-21 NOTE — Patient Instructions (Signed)
  Place gynecology postoperative visit patient instructions here.  

## 2015-08-23 ENCOUNTER — Ambulatory Visit (INDEPENDENT_AMBULATORY_CARE_PROVIDER_SITE_OTHER): Payer: BLUE CROSS/BLUE SHIELD | Admitting: Obstetrics and Gynecology

## 2015-08-23 ENCOUNTER — Encounter: Payer: Self-pay | Admitting: Obstetrics and Gynecology

## 2015-08-23 NOTE — Patient Instructions (Signed)
  Place postpartum visit patient instructions here.  

## 2015-08-23 NOTE — Progress Notes (Signed)
  Subjective:     Sharon Warren is a 30 y.o. female who presents for a postpartum visit. She is 6 weeks postpartum following a low cervical transverse Cesarean section with BTL. I have fully reviewed the prenatal and intrapartum course. The delivery was at 39 gestational weeks. Outcome: repeat cesarean section, low transverse incision and BTL. Anesthesia: spinal. Postpartum course has been uncomplicated. Baby's course has been uncomplicated. Baby is feeding by breast. Bleeding no bleeding. Bowel function is normal. Bladder function is normal. Patient is not sexually active. Contraception method is tubal ligation. Postpartum depression screening: negative.  The following portions of the patient's history were reviewed and updated as appropriate: allergies, current medications, past family history, past medical history, past social history, past surgical history and problem list.  Review of Systems A comprehensive review of systems was negative.   Objective:    BP 111/68 mmHg  Pulse 87  Ht 5\' 3"  (1.6 m)  Wt 191 lb 4.8 oz (86.773 kg)  BMI 33.90 kg/m2  Breastfeeding? Yes  General:  alert, cooperative, appears stated age and moderately obese   Breasts:  not examined  Lungs: clear to auscultation bilaterally  Heart:  regular rate and rhythm, S1, S2 normal, no murmur, click, rub or gallop  Abdomen: soft, non-tender; bowel sounds normal; no masses,  no organomegaly   Vulva:  normal  Vagina: normal vagina, no discharge, exudate, lesion, or erythema  Cervix:  multiparous appearance  Corpus: normal size, contour, position, consistency, mobility, non-tender  Adnexa:  normal adnexa and no mass, fullness, tenderness  Rectal Exam: Not performed.        Assessment:     6 weeks postpartum exam. Pap smear not done at today's visit.   Plan:    1. Contraception: tubal ligation 2. breastfeeding 3. Follow up in: 4 months for AE or as needed.

## 2015-08-24 ENCOUNTER — Encounter: Payer: Self-pay | Admitting: Obstetrics and Gynecology

## 2015-08-24 LAB — CBC
HEMOGLOBIN: 12.8 g/dL (ref 11.1–15.9)
Hematocrit: 39.2 % (ref 34.0–46.6)
MCH: 29.3 pg (ref 26.6–33.0)
MCHC: 32.7 g/dL (ref 31.5–35.7)
MCV: 90 fL (ref 79–97)
PLATELETS: 203 10*3/uL (ref 150–379)
RBC: 4.37 x10E6/uL (ref 3.77–5.28)
RDW: 15.1 % (ref 12.3–15.4)
WBC: 7.3 10*3/uL (ref 3.4–10.8)

## 2015-08-24 LAB — IRON: IRON: 91 ug/dL (ref 27–159)

## 2015-08-24 LAB — VITAMIN D 25 HYDROXY (VIT D DEFICIENCY, FRACTURES): VIT D 25 HYDROXY: 45 ng/mL (ref 30.0–100.0)

## 2015-12-30 ENCOUNTER — Encounter: Payer: BLUE CROSS/BLUE SHIELD | Admitting: Obstetrics and Gynecology

## 2016-03-02 ENCOUNTER — Encounter: Payer: BLUE CROSS/BLUE SHIELD | Admitting: Obstetrics and Gynecology

## 2016-03-07 ENCOUNTER — Encounter: Payer: Self-pay | Admitting: Obstetrics and Gynecology

## 2016-03-07 ENCOUNTER — Other Ambulatory Visit: Payer: Self-pay | Admitting: Obstetrics and Gynecology

## 2016-03-07 ENCOUNTER — Ambulatory Visit (INDEPENDENT_AMBULATORY_CARE_PROVIDER_SITE_OTHER): Payer: BLUE CROSS/BLUE SHIELD | Admitting: Obstetrics and Gynecology

## 2016-03-07 VITALS — BP 111/72 | HR 83 | Ht 63.0 in | Wt 193.1 lb

## 2016-03-07 DIAGNOSIS — Z9851 Tubal ligation status: Secondary | ICD-10-CM | POA: Diagnosis not present

## 2016-03-07 DIAGNOSIS — Z01419 Encounter for gynecological examination (general) (routine) without abnormal findings: Secondary | ICD-10-CM

## 2016-03-07 DIAGNOSIS — E669 Obesity, unspecified: Secondary | ICD-10-CM

## 2016-03-07 DIAGNOSIS — Z23 Encounter for immunization: Secondary | ICD-10-CM

## 2016-03-07 NOTE — Patient Instructions (Signed)
Place annual gynecologic exam patient instructions here.

## 2016-03-07 NOTE — Progress Notes (Signed)
Subjective:     Sharon Warren is a 30 y.o. female and is here for a comprehensive physical exam. The patient reports no problems. Has had a tubal ligation with last caesarian section in 2017. Reports periods lasting 3-6 days with mild to moderate bleeding. No complains of severe cramping or PMS symptoms. Is no longer breastfeeding. 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 Desires flu vaccine Desires routine labs  Social History   Social History  . Marital status: Married    Spouse name: N/A  . Number of children: N/A  . Years of education: N/A   Occupational History  . Not on file.   Social History Main Topics  . Smoking status: Former Smoker    Quit date: 10/11/2014  . Smokeless tobacco: Never Used  . Alcohol use 0.0 oz/week  . Drug use: No  . Sexual activity: Yes    Birth control/ protection: None, Surgical   Other Topics Concern  . Not on file   Social History Narrative  . No narrative on file   Health Maintenance  Topic Date Due  . PAP SMEAR  05/30/2008  . INFLUENZA VACCINE  11/22/2015  . TETANUS/TDAP  04/28/2025  . HIV Screening  Completed    The following portions of the patient's history were reviewed and updated as appropriate: allergies, current medications, past family history, past medical history, past social history, past surgical history and problem list.  Review of Systems Pertinent items noted in HPI and remainder of comprehensive ROS otherwise negative.   Objective:    BP 111/72   Pulse 83   Ht 5\' 3"  (1.6 m)   Wt 193 lb 1.6 oz (87.6 kg)   LMP 02/29/2016   Breastfeeding? No   BMI 34.21 kg/m  General appearance: alert, cooperative and appears stated age Head: Normocephalic, without obvious abnormality, atraumatic Throat: lips, mucosa, and tongue normal; teeth and gums normal Neck: no adenopathy, no JVD, supple, symmetrical, trachea midline and thyroid not enlarged, symmetric, no tenderness/mass/nodules Back: symmetric, no curvature. ROM  normal. No CVA tenderness. Lungs: clear to auscultation bilaterally Breasts: normal appearance, no masses or tenderness Heart: regular rate and rhythm, S1, S2 normal, no murmur, click, rub or gallop Abdomen: soft, non-tender; bowel sounds normal; no masses,  no organomegaly Pelvic: cervix normal in appearance, external genitalia normal, no adnexal masses or tenderness, no cervical motion tenderness, rectovaginal septum normal, uterus normal size, shape, and consistency, vagina normal without discharge and mild tenderness over incision site Extremities: extremities normal, atraumatic, no cyanosis or edema Skin: Skin color, texture, turgor normal. No rashes or lesions Neurologic: Grossly normal    Assessment:    Healthy female exam.  Obesity Need for flu vaccine.      Plan:  Routine labs pending. Flu vaccine given. Follow up in 1 year for AE or sooner if needed.   See After Visit Summary for Counseling Recommendations

## 2016-03-08 LAB — CBC
HEMATOCRIT: 38.8 % (ref 34.0–46.6)
HEMOGLOBIN: 13 g/dL (ref 11.1–15.9)
MCH: 29.5 pg (ref 26.6–33.0)
MCHC: 33.5 g/dL (ref 31.5–35.7)
MCV: 88 fL (ref 79–97)
Platelets: 211 10*3/uL (ref 150–379)
RBC: 4.4 x10E6/uL (ref 3.77–5.28)
RDW: 13.1 % (ref 12.3–15.4)
WBC: 6 10*3/uL (ref 3.4–10.8)

## 2016-03-08 LAB — COMPREHENSIVE METABOLIC PANEL
A/G RATIO: 1.8 (ref 1.2–2.2)
ALK PHOS: 56 IU/L (ref 39–117)
ALT: 10 IU/L (ref 0–32)
AST: 13 IU/L (ref 0–40)
Albumin: 4.6 g/dL (ref 3.5–5.5)
BUN/Creatinine Ratio: 17 (ref 9–23)
BUN: 13 mg/dL (ref 6–20)
Bilirubin Total: 0.3 mg/dL (ref 0.0–1.2)
CO2: 24 mmol/L (ref 18–29)
CREATININE: 0.75 mg/dL (ref 0.57–1.00)
Calcium: 9.5 mg/dL (ref 8.7–10.2)
Chloride: 101 mmol/L (ref 96–106)
GFR calc Af Amer: 125 mL/min/{1.73_m2} (ref >59)
GFR calc non Af Amer: 109 mL/min/{1.73_m2} (ref >59)
GLOBULIN, TOTAL: 2.6 g/dL (ref 1.5–4.5)
Glucose: 94 mg/dL (ref 65–99)
POTASSIUM: 4.1 mmol/L (ref 3.5–5.2)
SODIUM: 141 mmol/L (ref 134–144)
Total Protein: 7.2 g/dL (ref 6.0–8.5)

## 2016-03-08 LAB — LIPID PANEL
CHOLESTEROL TOTAL: 143 mg/dL (ref 100–199)
Chol/HDL Ratio: 3.4 ratio units (ref 0.0–4.4)
HDL: 42 mg/dL (ref >39)
LDL Calculated: 84 mg/dL (ref 0–99)
TRIGLYCERIDES: 85 mg/dL (ref 0–149)
VLDL Cholesterol Cal: 17 mg/dL (ref 5–40)

## 2016-03-08 LAB — HEMOGLOBIN A1C
Est. average glucose Bld gHb Est-mCnc: 105 mg/dL
Hgb A1c MFr Bld: 5.3 % (ref 4.8–5.6)

## 2016-03-08 LAB — CYTOLOGY - PAP

## 2016-08-24 ENCOUNTER — Encounter: Payer: Self-pay | Admitting: Obstetrics and Gynecology

## 2016-08-24 ENCOUNTER — Ambulatory Visit (INDEPENDENT_AMBULATORY_CARE_PROVIDER_SITE_OTHER): Payer: BLUE CROSS/BLUE SHIELD | Admitting: Obstetrics and Gynecology

## 2016-08-24 VITALS — BP 104/70 | HR 79 | Ht 63.0 in | Wt 200.5 lb

## 2016-08-24 DIAGNOSIS — E669 Obesity, unspecified: Secondary | ICD-10-CM | POA: Diagnosis not present

## 2016-08-24 MED ORDER — PHENTERMINE HCL 37.5 MG PO TABS: 37.5000 mg | ORAL_TABLET | Freq: Every day | ORAL | 2 refills | Status: AC

## 2016-08-24 MED ORDER — CYANOCOBALAMIN 1000 MCG/ML IJ SOLN: 1000.0000 ug | INTRAMUSCULAR | 1 refills | Status: AC

## 2016-08-24 NOTE — Progress Notes (Signed)
Subjective:  Sharon Warren is a 31 y.o. G2P1102 at Unknown being seen today for weight loss management- initial visit.  Patient reports General ROS: negative and reports previous weight loss attempts: exercise and adipex in the past. Worked well, except adipex made her feel jittery a first.  .  Onset followed:   recent pregnancy. Past treatment has included: small frequent feedings, nutritional supplement, vitamin B-12 injections, appetite stimulant, exercise management and discontinuation of medication.  The following portions of the patient's history were reviewed and updated as appropriate: allergies, current medications, past family history, past medical history, past social history, past surgical history and problem list.   Objective:   Vitals:   08/24/16 0946  BP: 104/70  Pulse: 79  Weight: 200 lb 8 oz (90.9 kg)  Height: 5\' 3"  (1.6 m)    General:  Alert, oriented and cooperative. Patient is in no acute distress.  :   :   :   :   :   :   PE: Well groomed female in no current distress,   Mental Status: Normal mood and affect. Normal behavior. Normal judgment and thought content.   Current BMI: Body mass index is 35.52 kg/m.   Assessment and Plan:  Obesity  There are no diagnoses linked to this encounter.  Plan: low carb, High protein diet RX for adipex 37.5 mg daily and B12 1000mcg.ml monthly, to start now with first injection given at today's visit. Reviewed side-effects common to both medications and expected outcomes. Increase daily water intake to at least 8 bottle a day, every day.  Goal is to reduse weight by 10% by end of three months, and will re-evaluate then.  RTC in 4 weeks for Nurse visit to check weight & BP, and get next B12 injections.    Please refer to After Visit Summary for other counseling recommendations.    Joee Iovine N CaleShambley, CNM   Ajani Schnieders [employer] Kyrel Leighton, CNM      Consider the Low Glycemic Index Diet and 6 smaller meals daily .  This  boosts your metabolism and regulates your sugars:   Use the protein bar by Atkins because they have lots of fiber in them  Find the low carb flatbreads, tortillas and pita breads for sandwiches:  Joseph's makes a pita bread and a flat bread , available at [facility] [employer] and [employer]; Toufayah makes a low carb flatbread available at [employer] and HT that is 9 net carbs and 100 cal Mission makes a low carb whole wheat tortilla available at [employer],and most grocery stores with 6 net carbs and 210 cal  [place] yogurt can still have a lot of carbs .  Dannon Light N fit has 80 cal and 8 carbs

## 2016-08-24 NOTE — Patient Instructions (Signed)

## 2016-09-21 ENCOUNTER — Ambulatory Visit (INDEPENDENT_AMBULATORY_CARE_PROVIDER_SITE_OTHER): Payer: BLUE CROSS/BLUE SHIELD | Admitting: Obstetrics and Gynecology

## 2016-09-21 VITALS — BP 108/78 | HR 118 | Ht 63.0 in | Wt 191.3 lb

## 2016-09-21 DIAGNOSIS — E669 Obesity, unspecified: Secondary | ICD-10-CM | POA: Diagnosis not present

## 2016-09-21 MED ORDER — CYANOCOBALAMIN 1000 MCG/ML IJ SOLN
1000.0000 ug | Freq: Once | INTRAMUSCULAR | Status: AC
  Administered 2016-09-21: 1000 ug via INTRAMUSCULAR

## 2016-09-21 NOTE — Progress Notes (Signed)
Patient ID: Sharon Warren, female   DOB: 04/04/1986, 31 y.o.   MRN: [id number] Pt presents for weight, B/P, B-12 injection. No side effects of medication-Phentermine, or B-12.  Weight loss of 9 lbs. Encouraged eating healthy and exercise.

## 2016-10-17 ENCOUNTER — Other Ambulatory Visit: Payer: Self-pay | Admitting: Obstetrics and Gynecology

## 2017-03-08 ENCOUNTER — Encounter: Payer: BLUE CROSS/BLUE SHIELD | Admitting: Obstetrics and Gynecology

## 2022-03-07 ENCOUNTER — Other Ambulatory Visit: Payer: Self-pay

## 2022-03-07 ENCOUNTER — Other Ambulatory Visit: Payer: Self-pay | Admitting: Family Medicine

## 2022-03-07 DIAGNOSIS — R200 Anesthesia of skin: Secondary | ICD-10-CM

## 2022-03-07 DIAGNOSIS — G43809 Other migraine, not intractable, without status migrainosus: Secondary | ICD-10-CM

## 2022-03-08 ENCOUNTER — Other Ambulatory Visit: Payer: BLUE CROSS/BLUE SHIELD

## 2022-03-29 ENCOUNTER — Encounter: Payer: Self-pay | Admitting: Physician Assistant

## 2022-04-03 ENCOUNTER — Other Ambulatory Visit: Payer: Self-pay | Admitting: Physician Assistant

## 2022-04-03 DIAGNOSIS — N6322 Unspecified lump in the left breast, upper inner quadrant: Secondary | ICD-10-CM

## 2022-04-20 ENCOUNTER — Ambulatory Visit
Admission: RE | Admit: 2022-04-20 | Discharge: 2022-04-20 | Disposition: A | Discharge status: Home / Self Care | Payer: 59 | Source: Ambulatory Visit | Attending: Physician Assistant | Admitting: Physician Assistant

## 2022-04-20 DIAGNOSIS — N6322 Unspecified lump in the left breast, upper inner quadrant: Secondary | ICD-10-CM | POA: Insufficient documentation

## 2024-02-07 ENCOUNTER — Encounter: Admitting: Oncology

## 2024-02-14 ENCOUNTER — Encounter: Payer: Self-pay | Admitting: Oncology

## 2024-02-14 ENCOUNTER — Inpatient Hospital Stay: Attending: Oncology | Admitting: Oncology

## 2024-02-14 ENCOUNTER — Inpatient Hospital Stay

## 2024-02-14 VITALS — BP 129/92 | HR 98 | Temp 97.5°F | Resp 16 | Ht 63.0 in | Wt 221.0 lb

## 2024-02-14 DIAGNOSIS — Z8349 Family history of other endocrine, nutritional and metabolic diseases: Secondary | ICD-10-CM | POA: Diagnosis not present

## 2024-02-14 DIAGNOSIS — E611 Iron deficiency: Secondary | ICD-10-CM | POA: Diagnosis not present

## 2024-02-14 DIAGNOSIS — Z803 Family history of malignant neoplasm of breast: Secondary | ICD-10-CM | POA: Insufficient documentation

## 2024-02-14 DIAGNOSIS — Z87891 Personal history of nicotine dependence: Secondary | ICD-10-CM | POA: Insufficient documentation

## 2024-02-14 LAB — CBC WITH DIFFERENTIAL/PLATELET
Abs Immature Granulocytes: 0.02 K/uL (ref 0.00–0.07)
Basophils Absolute: 0.1 K/uL (ref 0.0–0.1)
Basophils Relative: 1 %
Eosinophils Absolute: 0.2 K/uL (ref 0.0–0.5)
Eosinophils Relative: 3 %
HCT: 40.5 % (ref 36.0–46.0)
Hemoglobin: 13.1 g/dL (ref 12.0–15.0)
Immature Granulocytes: 0 %
Lymphocytes Relative: 34 %
Lymphs Abs: 2.4 K/uL (ref 0.7–4.0)
MCH: 28.5 pg (ref 26.0–34.0)
MCHC: 32.3 g/dL (ref 30.0–36.0)
MCV: 88.2 fL (ref 80.0–100.0)
Monocytes Absolute: 0.5 K/uL (ref 0.1–1.0)
Monocytes Relative: 7 %
Neutro Abs: 4 K/uL (ref 1.7–7.7)
Neutrophils Relative %: 55 %
Platelets: 245 K/uL (ref 150–400)
RBC: 4.59 MIL/uL (ref 3.87–5.11)
RDW: 13.4 % (ref 11.5–15.5)
WBC: 7.3 K/uL (ref 4.0–10.5)
nRBC: 0 % (ref 0.0–0.2)

## 2024-02-14 LAB — RETIC PANEL
Immature Retic Fract: 9.7 % (ref 2.3–15.9)
RBC.: 4.61 MIL/uL (ref 3.87–5.11)
Retic Count, Absolute: 74.2 K/uL (ref 19.0–186.0)
Retic Ct Pct: 1.6 % (ref 0.4–3.1)
Reticulocyte Hemoglobin: 29.7 pg (ref >27.9)

## 2024-02-14 LAB — IRON AND TIBC
Iron: 42 ug/dL (ref 28–170)
Saturation Ratios: 11 % (ref 10.4–31.8)
TIBC: 386 ug/dL (ref 250–450)
UIBC: 344 ug/dL

## 2024-02-14 LAB — FERRITIN: Ferritin: 8 ng/mL — ABNORMAL LOW (ref 11–307)

## 2024-02-14 NOTE — Assessment & Plan Note (Signed)
 Check hemochromatosis screening test.

## 2024-02-14 NOTE — Assessment & Plan Note (Addendum)
 Previous lab results are consistent with iron deficiency without anemia.  Check cbc iron tibc ferritin  Lab Results  Component Value Date   HGB 13.1 02/14/2024   TIBC 386 02/14/2024   IRONPCTSAT 11 02/14/2024   FERRITIN 8 (L) 02/14/2024      I will hold off making Iron replacement decision until her hemochromatosis screening test result.   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  I recommend one dose of Venofer. Rationale and side effects were reviewed with patient.

## 2024-02-14 NOTE — Progress Notes (Addendum)
 Hematology/Oncology Consult note [phone] [phone]        REFERRING PROVIDER: Harvey Gaetana CROME, NP   CHIEF COMPLAINTS/REASON FOR VISIT:  Evaluation of iron deficiency.    ASSESSMENT & PLAN:   Iron deficiency Previous lab results are consistent with iron deficiency without anemia.  Check cbc iron tibc ferritin  Lab Results  Component Value Date   HGB 13.1 02/14/2024   TIBC 386 02/14/2024   IRONPCTSAT 11 02/14/2024   FERRITIN 8 (L) 02/14/2024      I will hold off making Iron replacement decision until her hemochromatosis screening test result.   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  I recommend one dose of Venofer. Rationale and side effects were reviewed with patient.    Family history of hemochromatosis Check hemochromatosis screening test.    Orders Placed This Encounter  Procedures   Ferritin    Standing Status:   Future    Number of Occurrences:   1    Expected Date:   02/14/2024    Expiration Date:   05/14/2024   Iron and TIBC    Standing Status:   Future    Number of Occurrences:   1    Expected Date:   02/14/2024    Expiration Date:   05/14/2024   CBC with Differential/Platelet    Standing Status:   Future    Number of Occurrences:   1    Expected Date:   02/14/2024    Expiration Date:   05/14/2024   Retic Panel    Standing Status:   Future    Number of Occurrences:   1    Expected Date:   02/14/2024    Expiration Date:   05/14/2024   Hemochromatosis DNA-PCR(c282y,h63d)    Standing Status:   Future    Number of Occurrences:   1    Expected Date:   02/14/2024    Expiration Date:   02/13/2025   Follow up PRN All questions were answered. The patient knows to call the clinic with any problems, questions or concerns.  Zelphia Cap, MD, PhD [facility] Hematology Oncology 02/14/2024   HISTORY OF PRESENTING ILLNESS:   Sharon Warren is a  38 y.o.  female with PMH listed below was seen in  consultation at the request of  Fields, Glenda L, NP  for evaluation of iron deficiency anemia.  Discussed the use of AI scribe software for clinical note transcription with the patient, who gave verbal consent to proceed.   She has a long-standing history of iron deficiency, treated with oral iron supplements. Despite treatment, ferritin levels remain low. She experiences heavy menstrual periods, which have become less severe with age, now lasting two to three days of heavy flow before tapering off.  A sleep study conducted a couple of years ago indicated mild sleep apnea, but CPAP therapy was not recommended. She does not smoke or drink alcohol.  Her family history is significant for her mother and aunt being diagnosed with hemochromatosis. She is concerned about the possibility of having inherited this condition. Additionally, her paternal grandfather had polycythemia vera.  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 Her B12 levels are within normal limits, <400.      MEDICAL HISTORY:  Past Medical History:  Diagnosis Date   Anxiety    Asthma  GDM (gestational diabetes mellitus) 2010   History of anemia    PONV (postoperative nausea and vomiting)    nausea from pain medicine   Transfusion history     SURGICAL HISTORY: Past Surgical History:  Procedure Laterality Date   CESAREAN SECTION  2010   CESAREAN SECTION N/A 07/15/2015   Procedure: REPEAT CESAREAN SECTION;  Surgeon: Archie Savers, MD;  Location: [facility] ORS;  Service: Obstetrics;  Laterality: N/A;   dental implants      SOCIAL HISTORY: Social History   Socioeconomic History   Marital status: Married    Spouse name: Not on file   Number of children: Not on file   Years of education: Not on file   Highest education level: Not on file  Occupational History   Not on file  Tobacco Use   Smoking  status: Former    Current packs/day: 0.00    Types: Cigarettes    Quit date: 10/11/2014    Years since quitting: 9.3   Smokeless tobacco: Never  Vaping Use   Vaping status: Never Used  Substance and Sexual Activity   Alcohol use: Yes    Alcohol/week: 0.0 standard drinks of alcohol   Drug use: No   Sexual activity: Yes    Birth control/protection: None, Surgical  Other Topics Concern   Not on file  Social History Narrative   Not on file   Social Drivers of Health   Financial Resource Strain: Low Risk  (01/17/2024)   Received from [facility]   Overall Financial Resource Strain (CARDIA)    Difficulty of Paying Living Expenses: Not hard at all  Food Insecurity: No Food Insecurity (02/14/2024)   Hunger Vital Sign    Worried About Running Out of Food in the Last Year: Never true    Ran Out of Food in the Last Year: Never true  Transportation Needs: No Transportation Needs (02/14/2024)   PRAPARE - Administrator, Civil Service (Medical): No    Lack of Transportation (Non-Medical): No  Physical Activity: Not on file  Stress: Not on file  Social Connections: Not on file  Intimate Partner Violence: Not At Risk (02/14/2024)   Humiliation, Afraid, Rape, and Kick questionnaire    Fear of Current or Ex-Partner: No    Emotionally Abused: No    Physically Abused: No    Sexually Abused: No    FAMILY HISTORY: Family History  Problem Relation Age of Onset   Diabetes Father    Breast cancer Maternal Aunt    Hypertension Maternal Grandfather    Heart disease Paternal Grandfather    Diabetes Paternal Grandfather     ALLERGIES:  has no known allergies.  MEDICATIONS:  Current Outpatient Medications  Medication Sig Dispense Refill   albuterol (VENTOLIN HFA) 108 (90 Base) MCG/ACT inhaler Inhale into the lungs.     fexofenadine (ALLEGRA) 180 MG tablet Take 180 mg by mouth daily.     fluticasone (FLONASE) 50 MCG/ACT nasal spray Place into both nostrils  daily.     nortriptyline (PAMELOR) 25 MG capsule Take 25 mg by mouth at bedtime.     OPZELURA 1.5 % CREA Apply topically.     cyanocobalamin  (,VITAMIN B-12,) 1000 MCG/ML injection Inject 1 mL (1,000 mcg total) into the muscle every 30 (thirty) days. 10 mL 1   ibuprofen  (ADVIL ,MOTRIN ) 600 MG tablet Take 1 tablet (600 mg total) by mouth every 6 (six) hours as needed for mild pain. (Patient not taking: Reported on 08/24/2016) 30  tablet 0   phentermine  (ADIPEX-P ) 37.5 MG tablet Take 1 tablet (37.5 mg total) by mouth daily before breakfast. 30 tablet 2   triamcinolone  (KENALOG ) 0.025 % ointment Apply 1 application topically 2 (two) times daily. (Patient not taking: Reported on 02/14/2024) 30 g 5   No current facility-administered medications for this visit.    Review of Systems  Constitutional:  Positive for fatigue. Negative for appetite change, chills and fever.  HENT:   Negative for hearing loss and voice change.   Eyes:  Negative for eye problems.  Respiratory:  Negative for chest tightness and cough.   Cardiovascular:  Negative for chest pain.  Gastrointestinal:  Negative for abdominal distention, abdominal pain and blood in stool.  Endocrine: Negative for hot flashes.  Genitourinary:  Positive for menstrual problem. Negative for difficulty urinating and frequency.   Musculoskeletal:  Negative for arthralgias.  Skin:  Negative for itching and rash.  Neurological:  Negative for extremity weakness.  Hematological:  Negative for adenopathy.  Psychiatric/Behavioral:  Negative for confusion.    PHYSICAL EXAMINATION:  Vitals:   02/14/24 0921  BP: (!) 129/92  Pulse: 98  Resp: 16  Temp: (!) 97.5 F (36.4 C)  SpO2: 100%   Filed Weights   02/14/24 0921  Weight: 221 lb (100.2 kg)    Physical Exam Constitutional:      General: She is not in acute distress.    Appearance: She is obese.  HENT:     Head: Normocephalic and atraumatic.  Eyes:     General: No scleral  icterus. Cardiovascular:     Rate and Rhythm: Normal rate and regular rhythm.     Heart sounds: Normal heart sounds.  Pulmonary:     Effort: Pulmonary effort is normal. No respiratory distress.     Breath sounds: Normal breath sounds. No wheezing.  Abdominal:     General: Bowel sounds are normal. There is no distension.     Palpations: Abdomen is soft.  Musculoskeletal:        General: No deformity. Normal range of motion.     Cervical back: Normal range of motion and neck supple.  Skin:    General: Skin is warm and dry.     Findings: No erythema or rash.  Neurological:     Mental Status: She is alert and oriented to person, place, and time. Mental status is at baseline.  Psychiatric:        Mood and Affect: Mood normal.     LABORATORY DATA:  I have reviewed the data as listed    Latest Ref Rng & Units 02/14/2024    9:51 AM 03/07/2016    8:53 AM 08/23/2015    3:15 PM  CBC  WBC 4.0 - 10.5 K/uL 7.3  6.0  7.3   Hemoglobin 12.0 - 15.0 g/dL 86.8  86.9  87.1   Hematocrit 36.0 - 46.0 % 40.5  38.8  39.2   Platelets 150 - 400 K/uL 245  211  203       Latest Ref Rng & Units 03/07/2016    8:53 AM 12/29/2014    3:20 AM 09/27/2008    6:18 PM  CMP  Glucose 65 - 99 mg/dL 94  89  72   BUN 6 - 20 mg/dL 13   4   Creatinine 9.42 - 1.00 mg/dL 9.24   9.47   Sodium 865 - 144 mmol/L 141   137   Potassium 3.5 - 5.2 mmol/L 4.1   3.3  Chloride 96 - 106 mmol/L 101   105   CO2 18 - 29 mmol/L 24   23   Calcium 8.7 - 10.2 mg/dL 9.5   9.0   Total Protein 6.0 - 8.5 g/dL 7.2   5.9   Total Bilirubin 0.0 - 1.2 mg/dL 0.3   0.7   Alkaline Phos 39 - 117 IU/L 56   114   AST 0 - 40 IU/L 13   24   ALT 0 - 32 IU/L 10   13       RADIOGRAPHIC STUDIES: I have personally reviewed the radiological images as listed and agreed with the findings in the report. No results found.

## 2024-02-19 LAB — HEMOCHROMATOSIS DNA-PCR(C282Y,H63D)

## 2024-02-24 ENCOUNTER — Ambulatory Visit: Payer: Self-pay | Admitting: Oncology

## 2024-02-24 ENCOUNTER — Encounter: Payer: Self-pay | Admitting: Oncology

## 2024-02-24 DIAGNOSIS — E611 Iron deficiency: Secondary | ICD-10-CM

## 2024-02-24 NOTE — Addendum Note (Signed)
 Addended by: BABARA CALL on: 02/24/2024 11:20 PM   Modules accepted: Orders

## 2024-02-25 ENCOUNTER — Encounter: Payer: Self-pay | Admitting: Oncology

## 2024-02-25 NOTE — Telephone Encounter (Signed)
-----   Message from Zelphia Cap sent at 02/24/2024 11:18 PM EST ----- Please let patient know that iron panel showed decreased ferritin, indicating iron deficiency, without anemia.she is also a carrier of hemochromatosis gene mutation. Currently there is no signs of  iron overload.  I recommend her to get one dose of IV iron treatment to improve iron store. If she agrees please arrange. She can continue oral iron supplementation.  She can follow up in 3 months, lab prior to MD +/- Venofer. Check iron tibc ferritin cbc ----- Message ----- From: Interface, Lab In [place] Sent: 02/14/2024  10:04 AM EST To: Zelphia Cap, MD

## 2024-02-25 NOTE — Telephone Encounter (Signed)
 Spoke to pt and informed her of lab results and MD recommendation. Pt agreeable to one dose of IV iron. Pt call transferred to scheduling to set up appts.

## 2024-03-06 ENCOUNTER — Inpatient Hospital Stay: Attending: Oncology

## 2024-03-06 VITALS — BP 125/95 | HR 100 | Temp 96.5°F | Resp 18

## 2024-03-06 DIAGNOSIS — E611 Iron deficiency: Secondary | ICD-10-CM | POA: Diagnosis present

## 2024-03-06 MED ORDER — IRON SUCROSE 20 MG/ML IV SOLN
200.0000 mg | Freq: Once | INTRAVENOUS | Status: AC
  Administered 2024-03-06: 200 mg via INTRAVENOUS
  Filled 2024-03-06: qty 10

## 2024-03-06 NOTE — Patient Instructions (Signed)

## 2024-06-05 ENCOUNTER — Inpatient Hospital Stay

## 2024-06-12 ENCOUNTER — Inpatient Hospital Stay

## 2024-06-12 ENCOUNTER — Inpatient Hospital Stay: Admitting: Oncology
